# Patient Record
Sex: Female | Born: 1945 | Race: Black or African American | Hispanic: No | Smoking: Never smoker
Health system: Southern US, Community
[De-identification: ages and names within clinical notes are randomized; demographics above are authoritative.]

## PROBLEM LIST (undated history)

## (undated) DIAGNOSIS — H409 Unspecified glaucoma: Secondary | ICD-10-CM

## (undated) DIAGNOSIS — I10 Essential (primary) hypertension: Secondary | ICD-10-CM

## (undated) DIAGNOSIS — E039 Hypothyroidism, unspecified: Secondary | ICD-10-CM

## (undated) DIAGNOSIS — M6530 Trigger finger, unspecified finger: Secondary | ICD-10-CM

## (undated) DIAGNOSIS — N951 Menopausal and female climacteric states: Secondary | ICD-10-CM

## (undated) DIAGNOSIS — K219 Gastro-esophageal reflux disease without esophagitis: Secondary | ICD-10-CM

## (undated) DIAGNOSIS — G4700 Insomnia, unspecified: Secondary | ICD-10-CM

## (undated) DIAGNOSIS — E2839 Other primary ovarian failure: Secondary | ICD-10-CM

## (undated) DIAGNOSIS — K5900 Constipation, unspecified: Secondary | ICD-10-CM

## (undated) DIAGNOSIS — M1990 Unspecified osteoarthritis, unspecified site: Secondary | ICD-10-CM

## (undated) DIAGNOSIS — E079 Disorder of thyroid, unspecified: Secondary | ICD-10-CM

## (undated) DIAGNOSIS — R202 Paresthesia of skin: Secondary | ICD-10-CM

## (undated) DIAGNOSIS — E7800 Pure hypercholesterolemia, unspecified: Secondary | ICD-10-CM

## (undated) DIAGNOSIS — M25541 Pain in joints of right hand: Secondary | ICD-10-CM

## (undated) DIAGNOSIS — J45909 Unspecified asthma, uncomplicated: Secondary | ICD-10-CM

## (undated) DIAGNOSIS — H9311 Tinnitus, right ear: Secondary | ICD-10-CM

## (undated) DIAGNOSIS — M8580 Other specified disorders of bone density and structure, unspecified site: Secondary | ICD-10-CM

## (undated) DIAGNOSIS — M179 Osteoarthritis of knee, unspecified: Secondary | ICD-10-CM

## (undated) DIAGNOSIS — G4730 Sleep apnea, unspecified: Secondary | ICD-10-CM

## (undated) HISTORY — PX: BREAST EXCISIONAL BIOPSY: SUR124

## (undated) HISTORY — DX: Paresthesia of skin: R20.2

## (undated) HISTORY — DX: Other primary ovarian failure: E28.39

## (undated) HISTORY — DX: Hypercalcemia: E83.52

## (undated) HISTORY — DX: Menopausal and female climacteric states: N95.1

## (undated) HISTORY — DX: Pure hypercholesterolemia, unspecified: E78.00

## (undated) HISTORY — PX: GLAUCOMA SURGERY: SHX656

## (undated) HISTORY — DX: Gastro-esophageal reflux disease without esophagitis: K21.9

## (undated) HISTORY — DX: Trigger finger, unspecified finger: M65.30

## (undated) HISTORY — DX: Other specified disorders of bone density and structure, unspecified site: M85.80

## (undated) HISTORY — DX: Osteoarthritis of knee, unspecified: M17.9

## (undated) HISTORY — DX: Pain in joints of right hand: M25.541

## (undated) HISTORY — PX: ABDOMINAL HYSTERECTOMY: SHX81

## (undated) HISTORY — PX: SHOULDER SURGERY: SHX246

## (undated) HISTORY — DX: Constipation, unspecified: K59.00

## (undated) HISTORY — PX: OTHER SURGICAL HISTORY: SHX169

## (undated) HISTORY — DX: Insomnia, unspecified: G47.00

## (undated) HISTORY — DX: Tinnitus, right ear: H93.11

---

## 2000-03-03 ENCOUNTER — Encounter (HOSPITAL_BASED_OUTPATIENT_CLINIC_OR_DEPARTMENT_OTHER): Payer: Self-pay | Admitting: General Surgery

## 2000-03-03 ENCOUNTER — Encounter: Admission: RE | Admit: 2000-03-03 | Discharge: 2000-03-03 | Payer: Self-pay | Admitting: General Surgery

## 2001-03-30 ENCOUNTER — Emergency Department (HOSPITAL_COMMUNITY): Admission: EM | Admit: 2001-03-30 | Discharge: 2001-03-30 | Payer: Self-pay | Admitting: Emergency Medicine

## 2001-03-30 ENCOUNTER — Encounter: Payer: Self-pay | Admitting: Emergency Medicine

## 2001-06-08 ENCOUNTER — Encounter: Admission: RE | Admit: 2001-06-08 | Discharge: 2001-06-08 | Payer: Self-pay | Admitting: General Surgery

## 2001-06-08 ENCOUNTER — Encounter (HOSPITAL_BASED_OUTPATIENT_CLINIC_OR_DEPARTMENT_OTHER): Payer: Self-pay | Admitting: General Surgery

## 2001-06-23 ENCOUNTER — Ambulatory Visit (HOSPITAL_BASED_OUTPATIENT_CLINIC_OR_DEPARTMENT_OTHER): Admission: RE | Admit: 2001-06-23 | Discharge: 2001-06-23 | Payer: Self-pay | Admitting: Specialist

## 2001-07-01 ENCOUNTER — Other Ambulatory Visit: Admission: RE | Admit: 2001-07-01 | Discharge: 2001-07-01 | Payer: Self-pay | Admitting: Obstetrics and Gynecology

## 2005-03-17 ENCOUNTER — Encounter: Admission: RE | Admit: 2005-03-17 | Discharge: 2005-03-17 | Payer: Self-pay | Admitting: Family Medicine

## 2006-04-05 ENCOUNTER — Encounter: Admission: RE | Admit: 2006-04-05 | Discharge: 2006-04-05 | Payer: Self-pay | Admitting: Family Medicine

## 2007-04-29 ENCOUNTER — Encounter: Admission: RE | Admit: 2007-04-29 | Discharge: 2007-04-29 | Payer: Self-pay | Admitting: Family Medicine

## 2007-09-01 ENCOUNTER — Emergency Department (HOSPITAL_COMMUNITY): Admission: EM | Admit: 2007-09-01 | Discharge: 2007-09-01 | Payer: Self-pay | Admitting: Emergency Medicine

## 2008-04-30 ENCOUNTER — Encounter: Admission: RE | Admit: 2008-04-30 | Discharge: 2008-04-30 | Payer: Self-pay | Admitting: Family Medicine

## 2009-05-01 ENCOUNTER — Encounter: Admission: RE | Admit: 2009-05-01 | Discharge: 2009-05-01 | Payer: Self-pay | Admitting: Family Medicine

## 2009-05-15 ENCOUNTER — Encounter: Admission: RE | Admit: 2009-05-15 | Discharge: 2009-05-15 | Payer: Self-pay | Admitting: Family Medicine

## 2010-05-02 ENCOUNTER — Encounter: Admission: RE | Admit: 2010-05-02 | Discharge: 2010-05-02 | Payer: Self-pay | Admitting: Family Medicine

## 2011-02-20 NOTE — Op Note (Signed)
[place]  Patient:    BRIANNON, BOGGIO Visit Number: [id number] MRN: [id number]          Service Type: DSU Location: [facility] Attending Physician:  Erasmo Leventhal Dictated by:   R. Valma Cava, M.D. Proc. Date: 06/23/01 Admit Date:  06/23/2001                             Operative Report  PREOPERATIVE DIAGNOSES: 1. Right shoulder impingement syndrome. 2. Possible cuff tear. 2. Degenerative acromioclavicular joint.  POSTOPERATIVE DIAGNOSES: 1. Right shoulder _____ impingement syndrome. 2. Partial-thickness bursal surface tear, rotator cuff. 3. Degenerative acromioclavicular joint.  PROCEDURES: 1. Right shoulder arthroscopic evaluation. 2. Debridement of partial cuff tear. 3. Arthroscopic subacromial decompression. 4. Arthroscopic distal clavicle resection, Mumford procedure.  SURGEON:  Daniel L. Thomasena Edis, M.D.  ASSISTANT:  Irena Cords, P.A.-C.  ANESTHESIA:  Interscalene block, general, Maren Beach, M.D.  ESTIMATED BLOOD LOSS:  Less than 10 cc.  DRAINS:  None.  COMPLICATIONS:  None.  DISPOSITION:  To PACU stable.  DESCRIPTION OF PROCEDURE:  The patient was counseled in the holding area, the correct side was identified, the chart was reviewed, physical was done, and chart was signed appropriately.  IV started, blocks were given.  An interscalene block was infiltrated by Dr. Katrinka Blazing.  She was then taken to the OR, placed in the supine position, general endotracheal anesthesia, placed in the left lateral decubitus position, properly padded and bumped.  The right shoulder was examined with a full range of motion and stable.  Prepped with Duraprep and draped in a sterile fashion.  An _____ shoulder positioner was utilized in 30 degrees of abduction, 10 degrees of forward flexion, and 15 pounds of longitudinal traction.  Portal sites were marked with a marking pen, and then I used 8 cc of 0.5% Marcaine with epinephrine  after being instructed by the nurse anesthetist that it would be the appropriate dose based upon age and weight.  A posterior portal was created and the arthroscope was placed into the glenohumeral joint.  Diagnostic arthroscopy revealed no significant intra-articular abnormalities, and the articular cartilage was healthy.  The labrum was stable, biceps tendon was intact, and the rotator cuff appeared to be intact on the articular surface.  No evidence of instability.  Irrigated and all arthroscopic equipment removed.  The arthroscope then transferred to the subacromial region, where intense subacromial bursitis was encountered.  Anterolateral portal was created, staying proximal to isolate the nerve.  Motorized shaver was introduced, and subacromial bursectomy was performed.  The rotator cuff was found to have a partial-thickness tear of approximately 30% on the bursal surface, 1.5 cm in length, transverse in nature, at the junction of the supraspinatus and infraspinatus.  I did not feel this would require repair.  The edges were debrided back lightly with the motorized shaver back to healthy tissue.  He had a marked type 2 acromion.  The vapor system was used to remove the periosteum and release the CA ligament, and hemostasis was obtained.  A bur was then placed posteriorly and an anterior inferior acromioplasty was performed, converting from a type 2 to a type 1 acromion morphology, decompressing the subacromial region.  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  Accessory anterior portal was made just anterior to the [facility] joint.  A shaver was introduced.  The capsule was  resected, hemostasis obtained, and a bur was placed and the lateral 1.5 cm of clavicle was removed circumferentially.  The capsule was left intact.  The clavicle was palpated, found to be stable.  Subacromial was debrided of  any arthroscopic debris, and meticulous hemostasis was performed. We inspected, and no other abnormality was noted and after copious irrigation, all arthroscopic equipment was removed.  She was taken out of traction.  She had normal pulses at the end of the case. Portals were closed with 4-0 nylon suture, sterile compressive dressing was applied, and the shoulder was placed into a sling.  She was then turned supine and awakened and extubated and taken from the operating room in stable condition.  There were no complications.  Sponge and needle count were correct. Dictated by:   R. Valma Cava, M.D. Attending Physician:  Erasmo Leventhal DD:  06/23/01 TD:  06/23/01 Job: [id number]

## 2011-04-17 ENCOUNTER — Other Ambulatory Visit: Payer: Self-pay | Admitting: Family Medicine

## 2011-04-17 DIAGNOSIS — Z1231 Encounter for screening mammogram for malignant neoplasm of breast: Secondary | ICD-10-CM

## 2011-05-11 ENCOUNTER — Ambulatory Visit: Payer: Self-pay

## 2011-05-13 ENCOUNTER — Ambulatory Visit
Admission: RE | Admit: 2011-05-13 | Discharge: 2011-05-13 | Disposition: A | Discharge status: Home / Self Care | Payer: BC Managed Care – PPO | Source: Ambulatory Visit | Attending: Family Medicine | Admitting: Family Medicine

## 2011-05-13 DIAGNOSIS — Z1231 Encounter for screening mammogram for malignant neoplasm of breast: Secondary | ICD-10-CM

## 2012-01-03 ENCOUNTER — Ambulatory Visit (INDEPENDENT_AMBULATORY_CARE_PROVIDER_SITE_OTHER): Payer: BC Managed Care – PPO | Admitting: Family Medicine

## 2012-01-03 VITALS — BP 127/79 | HR 68 | Temp 97.9°F | Resp 16 | Ht 65.5 in | Wt 181.0 lb

## 2012-01-03 DIAGNOSIS — M1710 Unilateral primary osteoarthritis, unspecified knee: Secondary | ICD-10-CM

## 2012-01-03 DIAGNOSIS — IMO0002 Reserved for concepts with insufficient information to code with codable children: Secondary | ICD-10-CM

## 2012-01-03 DIAGNOSIS — L0390 Cellulitis, unspecified: Secondary | ICD-10-CM

## 2012-01-03 DIAGNOSIS — L0291 Cutaneous abscess, unspecified: Secondary | ICD-10-CM

## 2012-01-03 DIAGNOSIS — M1712 Unilateral primary osteoarthritis, left knee: Secondary | ICD-10-CM

## 2012-01-03 LAB — POCT CBC
HCT, POC: 38.4 % (ref 37.7–47.9)
Lymph, poc: 2.9 (ref 0.6–3.4)
MCHC: 31.8 g/dL (ref 31.8–35.4)
MID (cbc): 0.5 (ref 0–0.9)
MPV: 8.8 fL (ref 0–99.8)
POC Granulocyte: 2.4 (ref 2–6.9)
POC LYMPH PERCENT: 50.2 %L — AB (ref 10–50)
POC MID %: 8.8 %M (ref 0–12)
Platelet Count, POC: 234 10*3/uL (ref 142–424)
RDW, POC: 14.3 %

## 2012-01-03 LAB — POCT SEDIMENTATION RATE: POCT SED RATE: 54 mm/hr — AB (ref 0–22)

## 2012-01-03 MED ORDER — DOXYCYCLINE HYCLATE 100 MG PO TABS: 100.0000 mg | ORAL_TABLET | Freq: Two times a day (BID) | ORAL | Status: AC

## 2012-01-03 NOTE — Progress Notes (Signed)
  Subjective:    Patient ID: Gabrielle [name], female    DOB: [birth year], 66 y.o.   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 Has received (2) synvisc injections into both knees. Most recent injections on Wednesday. Since swelling and redness in (L) knee and lower extremity Pain at times with weight bearing and climbing stairs    Review of Systems     Objective:   Physical Exam   Results for orders placed in visit on 01/03/12  POCT CBC      Component Value Range   WBC 5.8  4.6 - 10.2 (K/uL)   Lymph, poc 2.9  0.6 - 3.4    POC LYMPH PERCENT 50.2 (*) 10 - 50 (%L)   MID (cbc) 0.5  0 - 0.9    POC MID % 8.8  0 - 12 (%M)   POC Granulocyte 2.4  2 - 6.9    Granulocyte percent 41.0  37 - 80 (%G)   RBC 4.23  4.04 - 5.48 (M/uL)   Hemoglobin 12.2  12.2 - 16.2 (g/dL)   HCT, POC 82.9  56.2 - 47.9 (%)   MCV 90.7  80 - 97 (fL)   MCH, POC 28.8  27 - 31.2 (pg)   MCHC 31.8  31.8 - 35.4 (g/dL)   RDW, POC 13.0     Platelet Count, POC 234  142 - 424 (K/uL)   MPV 8.8  0 - 99.8 (fL)  POCT SEDIMENTATION RATE      Component Value Range   POCT SED RATE 54 (*) 0 - 22 (mm/hr)         Assessment & Plan:   1. Cellulitis  POCT CBC, POCT SEDIMENTATION RATE  2. Left knee DJD     Doxycycline 100 mg Bid X 10days Mobic 7.5 mg Bid prn #30 RICE Patient has established follow up at [facility] and with Orthopedics this coming week. She will contact me should she have any problems in the interim.

## 2012-01-04 ENCOUNTER — Telehealth: Payer: Self-pay

## 2012-01-04 NOTE — Telephone Encounter (Signed)
[facility] sent a fax this morning for this patients records from 01/03/12 date of service  Pt is in the office now  Please send information - see fax  Sent this morning

## 2012-01-04 NOTE — Telephone Encounter (Signed)
Release received. Last office notes faxed via Epic.

## 2012-01-05 ENCOUNTER — Ambulatory Visit (HOSPITAL_COMMUNITY)
Admission: RE | Admit: 2012-01-05 | Discharge: 2012-01-05 | Disposition: A | Discharge status: Home / Self Care | Payer: BC Managed Care – PPO | Source: Ambulatory Visit | Attending: Family Medicine | Admitting: Family Medicine

## 2012-01-05 DIAGNOSIS — M7989 Other specified soft tissue disorders: Secondary | ICD-10-CM

## 2012-01-05 DIAGNOSIS — R609 Edema, unspecified: Secondary | ICD-10-CM

## 2012-01-05 DIAGNOSIS — M79609 Pain in unspecified limb: Secondary | ICD-10-CM

## 2012-01-05 DIAGNOSIS — R52 Pain, unspecified: Secondary | ICD-10-CM

## 2012-01-05 NOTE — Progress Notes (Signed)
*  PRELIMINARY RESULTS* Vascular Ultrasound Left lower extremity venous duplex has been completed.  Preliminary findings: Left= No evidence of DVT or baker's cyst.  Farrel Demark RDMS 01/05/2012, 9:33 AM

## 2012-04-14 ENCOUNTER — Other Ambulatory Visit: Payer: Self-pay | Admitting: Family Medicine

## 2012-04-14 DIAGNOSIS — Z1231 Encounter for screening mammogram for malignant neoplasm of breast: Secondary | ICD-10-CM

## 2012-05-16 ENCOUNTER — Ambulatory Visit
Admission: RE | Admit: 2012-05-16 | Discharge: 2012-05-16 | Disposition: A | Discharge status: Home / Self Care | Payer: BC Managed Care – PPO | Source: Ambulatory Visit | Attending: Family Medicine | Admitting: Family Medicine

## 2012-05-16 DIAGNOSIS — Z1231 Encounter for screening mammogram for malignant neoplasm of breast: Secondary | ICD-10-CM

## 2013-04-27 ENCOUNTER — Other Ambulatory Visit: Payer: Self-pay

## 2013-04-27 DIAGNOSIS — Z1231 Encounter for screening mammogram for malignant neoplasm of breast: Secondary | ICD-10-CM

## 2013-05-22 ENCOUNTER — Ambulatory Visit
Admission: RE | Admit: 2013-05-22 | Discharge: 2013-05-22 | Disposition: A | Discharge status: Home / Self Care | Payer: Medicare PPO | Source: Ambulatory Visit

## 2013-05-22 DIAGNOSIS — Z1231 Encounter for screening mammogram for malignant neoplasm of breast: Secondary | ICD-10-CM

## 2014-04-04 ENCOUNTER — Emergency Department (INDEPENDENT_AMBULATORY_CARE_PROVIDER_SITE_OTHER)
Admission: EM | Admit: 2014-04-04 | Discharge: 2014-04-04 | Disposition: A | Discharge status: Home / Self Care | Payer: Medicare PPO | Source: Home / Self Care

## 2014-04-04 ENCOUNTER — Encounter (HOSPITAL_COMMUNITY): Payer: Self-pay | Admitting: Emergency Medicine

## 2014-04-04 DIAGNOSIS — R0600 Dyspnea, unspecified: Secondary | ICD-10-CM

## 2014-04-04 DIAGNOSIS — R0609 Other forms of dyspnea: Secondary | ICD-10-CM

## 2014-04-04 DIAGNOSIS — R0989 Other specified symptoms and signs involving the circulatory and respiratory systems: Secondary | ICD-10-CM

## 2014-04-04 DIAGNOSIS — I10 Essential (primary) hypertension: Secondary | ICD-10-CM

## 2014-04-04 HISTORY — DX: Essential (primary) hypertension: I10

## 2014-04-04 HISTORY — DX: Disorder of thyroid, unspecified: E07.9

## 2014-04-04 NOTE — Discharge Instructions (Signed)
Hypertension °Hypertension is another name for high blood pressure. High blood pressure forces your heart to work harder to pump blood. A blood pressure reading has two numbers, which includes a higher number over a lower number (example: 110/72). °HOME CARE  °· Have your blood pressure rechecked by your doctor. °· Only take medicine as told by your doctor. Follow the directions carefully. The medicine does not work as well if you skip doses. Skipping doses also puts you at risk for problems. °· Do not smoke. °· Monitor your blood pressure at home as told by your doctor. °GET HELP IF: °· You think you are having a reaction to the medicine you are taking. °· You have repeat headaches or feel dizzy. °· You have puffiness (swelling) in your ankles. °· You have trouble with your vision. °GET HELP RIGHT AWAY IF:  °· You get a very bad headache and are confused. °· You feel weak, numb, or faint. °· You get chest or belly (abdominal) pain. °· You throw up (vomit). °· You cannot breathe very well. °MAKE SURE YOU:  °· Understand these instructions. °· Will watch your condition. °· Will get help right away if you are not doing well or get worse. °Document Released: 03/09/2008 Document Revised: 09/26/2013 Document Reviewed: 07/14/2013 °ExitCare® Patient Information ©2015 [employer]. This information is not intended to replace advice given to you by your health care provider. Make sure you discuss any questions you have with your health care provider. ° °Managing Your High Blood Pressure °Blood pressure is a measurement of how forceful your blood is pressing against the walls of the arteries. Arteries are muscular tubes within the circulatory system. Blood pressure does not stay the same. Blood pressure rises when you are active, excited, or nervous; and it lowers during sleep and relaxation. If the numbers measuring your blood pressure stay above normal most of the time, you are at risk for health problems. High blood  pressure (hypertension) is a long-term (chronic) condition in which blood pressure is elevated. °A blood pressure reading is recorded as two numbers, such as 120 over 80 (or 120/80). The first, higher number is called the systolic pressure. It is a measure of the pressure in your arteries as the heart beats. The second, lower number is called the diastolic pressure. It is a measure of the pressure in your arteries as the heart relaxes between beats.  °Keeping your blood pressure in a normal range is important to your overall health and prevention of health problems, such as heart disease and stroke. When your blood pressure is uncontrolled, your heart has to work harder than normal. High blood pressure is a very common condition in adults because blood pressure tends to rise with age. Men and women are equally likely to have hypertension but at different times in life. Before age 45, men are more likely to have hypertension. After 65 years of age, women are more likely to have it. Hypertension is especially common in African Americans. This condition often has no signs or symptoms. The cause of the condition is usually not known. Your caregiver can help you come up with a plan to keep your blood pressure in a normal, healthy range. °BLOOD PRESSURE STAGES °Blood pressure is classified into four stages: normal, prehypertension, stage 1, and stage 2. Your blood pressure reading will be used to determine what type of treatment, if any, is necessary. Appropriate treatment options are tied to these four stages:  °Normal °· Systolic pressure (mm Hg):   below 120.  Diastolic pressure (mm Hg): below 80. Prehypertension  Systolic pressure (mm Hg): 120 to 139.  Diastolic pressure (mm Hg): 80 to 89. Stage1  Systolic pressure (mm Hg): 140 to 159.  Diastolic pressure (mm Hg): 90 to 99. Stage2  Systolic pressure (mm Hg): 160 or above.  Diastolic pressure (mm Hg): 100 or above. RISKS RELATED TO HIGH BLOOD  PRESSURE Managing your blood pressure is an important responsibility. Uncontrolled high blood pressure can lead to:  A heart attack.  A stroke.  A weakened blood vessel (aneurysm).  Heart failure.  Kidney damage.  Eye damage.  Metabolic syndrome.  Memory and concentration problems. HOW TO MANAGE YOUR BLOOD PRESSURE Blood pressure can be managed effectively with lifestyle changes and medicines (if needed). Your caregiver will help you come up with a plan to bring your blood pressure within a normal range. Your plan should include the following: Education  Read all information provided by your caregivers about how to control blood pressure.  Educate yourself on the latest guidelines and treatment recommendations. New research is always being done to further define the risks and treatments for high blood pressure. Lifestylechanges  Control your weight.  Avoid smoking.  Stay physically active.  Reduce the amount of salt in your diet.  Reduce stress.  Control any chronic conditions, such as high cholesterol or diabetes.  Reduce your alcohol intake. Medicines  Several medicines (antihypertensive medicines) are available, if needed, to bring blood pressure within a normal range. Communication  Review all the medicines you take with your caregiver because there may be side effects or interactions.  Talk with your caregiver about your diet, exercise habits, and other lifestyle factors that may be contributing to high blood pressure.  See your caregiver regularly. Your caregiver can help you create and adjust your plan for managing high blood pressure. RECOMMENDATIONS FOR TREATMENT AND FOLLOW-UP  The following recommendations are based on current guidelines for managing high blood pressure in nonpregnant adults. Use these recommendations to identify the proper follow-up period or treatment option based on your blood pressure reading. You can discuss these options with your  caregiver.  Systolic pressure of 120 to 139 or diastolic pressure of 80 to 89: Follow up with your caregiver as directed.  Systolic pressure of 140 to 160 or diastolic pressure of 90 to 100: Follow up with your caregiver within 2 months.  Systolic pressure above 160 or diastolic pressure above 100: Follow up with your caregiver within 1 month.  Systolic pressure above 180 or diastolic pressure above 110: Consider antihypertensive therapy; follow up with your caregiver within 1 week.  Systolic pressure above 200 or diastolic pressure above 120: Begin antihypertensive therapy; follow up with your caregiver within 1 week. Document Released: 06/15/2012 Document Reviewed: 06/15/2012 [facility]ExitCare Patient Information 2015 [place]Care, [place]LL[place]. This information is not intended to replace advice given to you by your health care provider. Make sure you discuss any questions you have with your health care provider.  Shortness of Breath Shortness of breath means you have trouble breathing. It could also mean that you have a medical problem. You should get immediate medical care for shortness of breath. CAUSES   Not enough oxygen in the air such as with high altitudes or a smoke-filled room.  Certain lung diseases, infections, or problems.  Heart disease or conditions, such as angina or heart failure.  Low red blood cells (anemia).  Poor physical fitness, which can cause shortness of breath when you exercise.  Chest or  back injuries or stiffness.  Being overweight.  Smoking.  Anxiety, which can make you feel like you are not getting enough air. DIAGNOSIS  Serious medical problems can often be found during your physical exam. Tests may also be done to determine why you are having shortness of breath. Tests may include:  Chest X-rays.  Lung function tests.  Blood tests.  An electrocardiogram (ECG).  An ambulatory electrocardiogram. An ambulatory ECG records your heartbeat patterns over a  24-hour period.  Exercise testing.  A transthoracic echocardiogram (TTE). During echocardiography, sound waves are used to evaluate how blood flows through your heart.  A transesophageal echocardiogram (TEE).  Imaging scans. Your health care provider may not be able to find a cause for your shortness of breath after your exam. In this case, it is important to have a follow-up exam with your health care provider as directed.  TREATMENT  Treatment for shortness of breath depends on the cause of your symptoms and can vary greatly. HOME CARE INSTRUCTIONS   Do not smoke. Smoking is a common cause of shortness of breath. If you smoke, ask for help to quit.  Avoid being around chemicals or things that may bother your breathing, such as paint fumes and dust.  Rest as needed. Slowly resume your usual activities.  If medicines were prescribed, take them as directed for the full length of time directed. This includes oxygen and any inhaled medicines.  Keep all follow-up appointments as directed by your health care provider. SEEK MEDICAL CARE IF:   Your condition does not improve in the time expected.  You have a hard time doing your normal activities even with rest.  You have any new symptoms. SEEK IMMEDIATE MEDICAL CARE IF:   Your shortness of breath gets worse.  You feel light-headed, faint, or develop a cough not controlled with medicines.  You start coughing up blood.  You have pain with breathing.  You have chest pain or pain in your arms, shoulders, or abdomen.  You have a fever.  You are unable to walk up stairs or exercise the way you normally do. MAKE SURE YOU:  Understand these instructions.  Will watch your condition.  Will get help right away if you are not doing well or get worse. Document Released: 06/16/2001 Document Revised: 09/26/2013 Document Reviewed: 12/07/2011 [facility] SuitesExitCare Patient Information 2015 [place]ExitCare, MarylandLLC. This information is not intended to replace  advice given to you by your health care provider. Make sure you discuss any questions you have with your health care provider.

## 2014-04-04 NOTE — ED Notes (Signed)
Pt  Reports  Developed  Some  Shortness  Of  Breath  This  Am      And     Took  Sone  Of an  Albuterol  Inhaler       -  She      Reports        She got a  Little  Jittery         But  Now  Feels  Better             She  Is  Sitting upright on the  Exam table  Speaking in complete  sentances

## 2014-04-04 NOTE — ED Provider Notes (Signed)
Medical screening examination/treatment/procedure(s) were performed by non-physician practitioner and as supervising physician I was immediately available for consultation/collaboration.  Anuel Sitter, M.D.  Bre Pecina C Wylma Tatem, MD 04/04/14 1026 

## 2014-04-04 NOTE — ED Provider Notes (Signed)
CSN: [id number]     Arrival date & time 04/04/14  0910 History   First MD Initiated Contact with Patient 04/04/14 [phone]     Chief Complaint  Patient presents with  . Shortness of Breath   (Consider location/radiation/quality/duration/timing/severity/associated sxs/prior Treatment) HPI Comments: On a few occasions this 68 year old female has been awakening with the mild elevation in blood pressure. This morning it was 154/93. She has also had intermittent episodes of early morning shortness of breath that lasts for a few minutes. She becomes active and moving around the shortness of breath abates. In fact, the more movement and walking she does the better she feels. This morning she used an old Ventolin H. FA for her breathing and this made her temporarily jittery. She admits that she walks "a lot" during the day without symptoms. No chest pain at rest or with exertion.   Past Medical History  Diagnosis Date  . Hypertension   . Thyroid disease    Past Surgical History  Procedure Laterality Date  . Abdominal hysterectomy    . Shoulder surgery     Family History  Problem Relation Age of Onset  . Diabetes Father   . Cancer Father   . Hypertension Father    History  Substance Use Topics  . Smoking status: Never Smoker   . Smokeless tobacco: Not on file  . Alcohol Use: No   OB History   Grav Para Term Preterm Abortions TAB SAB Ect Mult Living                 Review of Systems  Constitutional: Negative for fever, activity change, appetite change and fatigue.  HENT: Negative.   Respiratory: Positive for shortness of breath. Negative for cough, wheezing and stridor.        This occurred earlier this morning that has since abated.  Cardiovascular: Negative.  Negative for chest pain, palpitations and leg swelling.  Gastrointestinal: Negative.   Genitourinary: Negative.   Musculoskeletal: Negative.   Skin: Negative for pallor and rash.  Neurological: Negative.     Allergies   Codeine; Latex; and Sulfa antibiotics  Home Medications   Prior to Admission medications   Medication Sig Start Date End Date Taking? Authorizing Provider  nebivolol (BYSTOLIC) 10 MG tablet Take 10 mg by mouth daily.   Yes Historical Provider, MD  omeprazole (PRILOSEC) 40 MG capsule Take 40 mg by mouth daily.   Yes Historical Provider, MD  hydrochlorothiazide (HYDRODIURIL) 25 MG tablet Take 25 mg by mouth daily.    Historical Provider, MD  levothyroxine (SYNTHROID, LEVOTHROID) 50 MCG tablet Take 50 mcg by mouth daily.    Historical Provider, MD  potassium chloride (K-DUR,KLOR-CON) 10 MEQ tablet Take 10 mEq by mouth 2 (two) times daily.    Historical Provider, MD  ranitidine (ZANTAC) 300 MG tablet Take 300 mg by mouth at bedtime.    Historical Provider, MD  simvastatin (ZOCOR) 20 MG tablet Take 20 mg by mouth every evening.    Historical Provider, MD   BP 137/74  Pulse 58  Temp(Src) 97.1 F (36.2 C) (Oral)  Resp 20  SpO2 96% Physical Exam  Nursing note and vitals reviewed. Constitutional: She is oriented to person, place, and time. She appears well-developed and well-nourished.  Eyes: Conjunctivae and EOM are normal.  Neck: Normal range of motion. Neck supple.  Cardiovascular: Normal rate, regular rhythm, normal heart sounds and intact distal pulses.   No murmur heard. Pulmonary/Chest: Effort normal and breath sounds normal. No respiratory  distress. She has no wheezes. She has no rales.  Musculoskeletal: She exhibits no edema and no tenderness.  Lymphadenopathy:    She has no cervical adenopathy.  Neurological: She is alert and oriented to person, place, and time. She exhibits normal muscle tone.  Skin: Skin is warm and dry.  Psychiatric: She has a normal mood and affect.    ED Course  Procedures (including critical care time) Labs Review Labs Reviewed - No data to display  Imaging Review No results found.   MDM   1. Essential hypertension   2. Dyspnea      Reassurance. Cont meds. Discuss with your doctor about any needed changes. VSS today. Pt asymptomatic. Normal exam.       Hayden Rasmussenavid Yasin Ducat, NP 04/04/14 [phone]

## 2014-05-08 ENCOUNTER — Other Ambulatory Visit: Payer: Self-pay

## 2014-05-08 DIAGNOSIS — Z1231 Encounter for screening mammogram for malignant neoplasm of breast: Secondary | ICD-10-CM

## 2014-05-23 ENCOUNTER — Ambulatory Visit
Admission: RE | Admit: 2014-05-23 | Discharge: 2014-05-23 | Disposition: A | Discharge status: Home / Self Care | Payer: Medicare PPO | Source: Ambulatory Visit

## 2014-05-23 DIAGNOSIS — Z1231 Encounter for screening mammogram for malignant neoplasm of breast: Secondary | ICD-10-CM

## 2015-05-06 ENCOUNTER — Other Ambulatory Visit: Payer: Self-pay

## 2015-05-06 DIAGNOSIS — Z1231 Encounter for screening mammogram for malignant neoplasm of breast: Secondary | ICD-10-CM

## 2015-06-12 ENCOUNTER — Ambulatory Visit
Admission: RE | Admit: 2015-06-12 | Discharge: 2015-06-12 | Disposition: A | Discharge status: Home / Self Care | Payer: Medicare PPO | Source: Ambulatory Visit

## 2015-06-12 DIAGNOSIS — Z1231 Encounter for screening mammogram for malignant neoplasm of breast: Secondary | ICD-10-CM

## 2016-05-22 ENCOUNTER — Other Ambulatory Visit: Payer: Self-pay | Admitting: Family Medicine

## 2016-05-22 DIAGNOSIS — Z1231 Encounter for screening mammogram for malignant neoplasm of breast: Secondary | ICD-10-CM

## 2016-06-17 ENCOUNTER — Ambulatory Visit
Admission: RE | Admit: 2016-06-17 | Discharge: 2016-06-17 | Disposition: A | Discharge status: Home / Self Care | Payer: Medicare Other | Source: Ambulatory Visit | Attending: Family Medicine | Admitting: Family Medicine

## 2016-06-17 DIAGNOSIS — Z1231 Encounter for screening mammogram for malignant neoplasm of breast: Secondary | ICD-10-CM

## 2017-05-31 ENCOUNTER — Other Ambulatory Visit: Payer: Self-pay | Admitting: Family Medicine

## 2017-05-31 DIAGNOSIS — Z1231 Encounter for screening mammogram for malignant neoplasm of breast: Secondary | ICD-10-CM

## 2017-06-21 ENCOUNTER — Ambulatory Visit: Payer: Medicare Other

## 2017-06-22 ENCOUNTER — Ambulatory Visit
Admission: RE | Admit: 2017-06-22 | Discharge: 2017-06-22 | Disposition: A | Discharge status: Home / Self Care | Payer: Medicare Other | Source: Ambulatory Visit | Attending: Family Medicine | Admitting: Family Medicine

## 2017-06-22 DIAGNOSIS — Z1231 Encounter for screening mammogram for malignant neoplasm of breast: Secondary | ICD-10-CM

## 2017-06-23 ENCOUNTER — Ambulatory Visit: Payer: Medicare Other

## 2017-10-05 HISTORY — PX: FOOT SURGERY: SHX648

## 2018-03-02 ENCOUNTER — Encounter (HOSPITAL_COMMUNITY): Payer: Self-pay

## 2018-03-02 ENCOUNTER — Emergency Department (HOSPITAL_COMMUNITY): Payer: Medicare Other

## 2018-03-02 ENCOUNTER — Emergency Department (HOSPITAL_COMMUNITY)
Admission: EM | Admit: 2018-03-02 | Discharge: 2018-03-02 | Disposition: A | Discharge status: Home / Self Care | Payer: Medicare Other | Attending: Emergency Medicine | Admitting: Emergency Medicine

## 2018-03-02 DIAGNOSIS — R079 Chest pain, unspecified: Secondary | ICD-10-CM | POA: Diagnosis not present

## 2018-03-02 DIAGNOSIS — Z79899 Other long term (current) drug therapy: Secondary | ICD-10-CM | POA: Diagnosis not present

## 2018-03-02 DIAGNOSIS — I10 Essential (primary) hypertension: Secondary | ICD-10-CM | POA: Diagnosis not present

## 2018-03-02 DIAGNOSIS — R519 Headache, unspecified: Secondary | ICD-10-CM

## 2018-03-02 DIAGNOSIS — R51 Headache: Secondary | ICD-10-CM | POA: Insufficient documentation

## 2018-03-02 NOTE — Discharge Instructions (Signed)
Continue taking your usual medications.  Consider restarting her CPAP.  Follow-up with your primary care doctor as needed for problems.

## 2018-03-02 NOTE — ED Triage Notes (Signed)
Pt complains of chest pain this am, she states that it was a sharp pressure like pain, it only happened once and she has no other complaints

## 2018-03-02 NOTE — ED Provider Notes (Signed)
[facility]-EMERGENCY DEPT Provider Note   CSN: [id number] Arrival date & time: 03/02/18  0865     History   Chief Complaint Chief Complaint  Patient presents with  . Chest Pain    HPI Gabrielle [name] is a 72 y.o. female.  HPI   She presents for evaluation of chest pain which occurred this morning as she was getting ready for work.  The discomfort was felt as "sharp," and in the upper chest.  She then felt like "my sinuses opened up," and developed a headache.  All of the symptoms resolved within 5 minutes.  She went to work but decided to come here to get checked because she was concerned.  No prior similar problems.  She took her medications this morning, and ate breakfast as usual.  No recent fever, chills, nausea, vomiting, weakness or dizziness.  She has noticed some occasional awakening at night gasping for breath, and is wondering if she needs to restart her CPAP.  She stopped it about 2 years ago, because she did not think it was helping.  There are no other known modifying factors.  Past Medical History:  Diagnosis Date  . Hypertension   . Thyroid disease     There are no active problems to display for this patient.   Past Surgical History:  Procedure Laterality Date  . ABDOMINAL HYSTERECTOMY    . SHOULDER SURGERY       OB History   None      Home Medications    Prior to Admission medications   Medication Sig Start Date End Date Taking? Authorizing Provider  hydrochlorothiazide (HYDRODIURIL) 25 MG tablet Take 25 mg by mouth daily.    [provider]  levothyroxine (SYNTHROID, LEVOTHROID) 50 MCG tablet Take 50 mcg by mouth daily.    [provider]  nebivolol (BYSTOLIC) 10 MG tablet Take 10 mg by mouth daily.    [provider]  omeprazole (PRILOSEC) 40 MG capsule Take 40 mg by mouth daily.    [provider]  potassium chloride (K-DUR,KLOR-CON) 10 MEQ tablet Take 10 mEq by mouth 2 (two) times daily.     [provider]  ranitidine (ZANTAC) 300 MG tablet Take 300 mg by mouth at bedtime.    [provider]  simvastatin (ZOCOR) 20 MG tablet Take 20 mg by mouth every evening.    [provider]    Family History Family History  Problem Relation Age of Onset  . Diabetes Father   . Cancer Father   . Hypertension Father     Social History Social History   Tobacco Use  . Smoking status: Never Smoker  . Smokeless tobacco: Never Used  Substance Use Topics  . Alcohol use: No  . Drug use: Never     Allergies   Codeine; Latex; and Sulfa antibiotics   Review of Systems Review of Systems  All other systems reviewed and are negative.    Physical Exam Updated Vital Signs BP 131/84 (BP Location: Left Arm)   Pulse (!) 54   Temp 97.6 F (36.4 C) (Oral)   Resp 16   SpO2 100%   Physical Exam  Constitutional: She is oriented to person, place, and time. She appears well-developed and well-nourished. No distress.  HENT:  Head: Normocephalic and atraumatic.  Eyes: Pupils are equal, round, and reactive to light. Conjunctivae and EOM are normal.  Neck: Normal range of motion and phonation normal. Neck supple.  Cardiovascular: Normal rate and  regular rhythm.  Pulmonary/Chest: Effort normal and breath sounds normal. No respiratory distress. She exhibits no tenderness.  Abdominal: Soft. She exhibits no distension. There is no tenderness. There is no guarding.  Musculoskeletal: Normal range of motion.  Neurological: She is alert and oriented to person, place, and time. She exhibits normal muscle tone.  Skin: Skin is warm and dry.  Psychiatric: She has a normal mood and affect. Her behavior is normal. Judgment and thought content normal.  Nursing note and vitals reviewed.    ED Treatments / Results  Labs (all labs ordered are listed, but only abnormal results are displayed) Labs Reviewed  BASIC METABOLIC PANEL  CBC  I-STAT TROPONIN, ED    EKG EKG  Interpretation  Date/Time:  Wednesday Mar 02 2018 09:07:43 EDT Ventricular Rate:  60 PR Interval:    QRS Duration: 97 QT Interval:  440 QTC Calculation: 440 R Axis:   28 Text Interpretation:  Sinus rhythm Low voltage, precordial leads No old tracing to compare Confirmed by Mancel Bale [phone]) on 03/02/2018 9:10:54 AM   Radiology Dg Chest 2 View  Result Date: 03/02/2018 CLINICAL DATA:  Acute onset of mid chest pain. EXAM: CHEST - 2 VIEW COMPARISON:  None. FINDINGS: The lungs are well-aerated and clear. There is no evidence of focal opacification, pleural effusion or pneumothorax. The heart is normal in size; the mediastinal contour is within normal limits. No acute osseous abnormalities are seen. IMPRESSION: No acute cardiopulmonary process seen. Electronically Signed   By: Roanna Raider M.D.   On: 03/02/2018 06:17    Procedures Procedures (including critical care time)  Medications Ordered in ED Medications - No data to display   Initial Impression / Assessment and Plan / ED Course  I have reviewed the triage vital signs and the nursing notes.  Pertinent labs & imaging results that were available during my care of the patient were reviewed by me and considered in my medical decision making (see chart for details).  Clinical Course as of Mar 02 949  Wed Mar 02, 2018  0945 No acute disease, images reviewed  DG Chest 2 View [EW]  0945 Normal EKG  EKG 12-Lead [EW]    Clinical Course User Index [EW] Mancel Bale, MD     Patient Vitals for the past 24 hrs:  BP Temp Temp src Pulse Resp SpO2  03/02/18 0910 131/84 - - (!) 54 16 100 %  03/02/18 0534 (!) 144/84 97.6 F (36.4 C) Oral 60 18 97 %    9:44 AM Reevaluation with update and discussion. After initial assessment and treatment, an updated evaluation reveals no change in clinical status, findings discussed with patient and all questions answered. Mancel Bale   Medical Decision Making: Nonspecific chest pain and  headache, very brief episode without recurrence.  Patient has been having some trouble with her sleep apnea, and may have disrupted sleep as contributor to the current symptoms.  Screening evaluation today with EKG and chest x-ray were reassuring.  Doubt ACS, PE or pneumonia.  CRITICAL CARE-no Performed by: Mancel Bale   Nursing Notes Reviewed/ Care Coordinated Applicable Imaging Reviewed Interpretation of Laboratory Data incorporated into ED treatment  The patient appears reasonably screened and/or stabilized for discharge and I doubt any other medical condition or other [facility] requiring further screening, evaluation, or treatment in the ED at this time prior to discharge.  Plan: Home Medications-continue usual; Home Treatments-rest, fluids, reinitiate CPAP use; return here if the recommended treatment, does not improve the symptoms; Recommended  follow up-PCP, as needed     Final Clinical Impressions(s) / ED Diagnoses   Final diagnoses:  Nonspecific chest pain  Nonintractable headache, unspecified chronicity pattern, unspecified headache type    ED Discharge Orders    None       Mancel Bale, MD 03/02/18 [phone]

## 2018-03-02 NOTE — ED Notes (Signed)
Dr Effie Shy in to speak with pt. He has elected not to obtain the blood work after talking with pt and reviewing EKG and chest x-ray

## 2018-05-16 ENCOUNTER — Other Ambulatory Visit: Payer: Self-pay | Admitting: Family Medicine

## 2018-05-16 DIAGNOSIS — Z1231 Encounter for screening mammogram for malignant neoplasm of breast: Secondary | ICD-10-CM

## 2018-06-23 ENCOUNTER — Ambulatory Visit
Admission: RE | Admit: 2018-06-23 | Discharge: 2018-06-23 | Disposition: A | Discharge status: Home / Self Care | Payer: Medicare Other | Source: Ambulatory Visit | Attending: Family Medicine | Admitting: Family Medicine

## 2018-06-23 DIAGNOSIS — Z1231 Encounter for screening mammogram for malignant neoplasm of breast: Secondary | ICD-10-CM

## 2019-06-14 ENCOUNTER — Other Ambulatory Visit: Payer: Self-pay | Admitting: Family Medicine

## 2019-06-14 DIAGNOSIS — Z1231 Encounter for screening mammogram for malignant neoplasm of breast: Secondary | ICD-10-CM

## 2019-07-28 ENCOUNTER — Other Ambulatory Visit: Payer: Self-pay

## 2019-07-28 ENCOUNTER — Ambulatory Visit
Admission: RE | Admit: 2019-07-28 | Discharge: 2019-07-28 | Disposition: A | Discharge status: Home / Self Care | Payer: Medicare Other | Source: Ambulatory Visit | Attending: Family Medicine | Admitting: Family Medicine

## 2019-07-28 DIAGNOSIS — Z1231 Encounter for screening mammogram for malignant neoplasm of breast: Secondary | ICD-10-CM

## 2019-07-31 ENCOUNTER — Other Ambulatory Visit: Payer: Self-pay | Admitting: Family Medicine

## 2019-07-31 DIAGNOSIS — R928 Other abnormal and inconclusive findings on diagnostic imaging of breast: Secondary | ICD-10-CM

## 2019-08-01 ENCOUNTER — Other Ambulatory Visit: Payer: Self-pay | Admitting: Family Medicine

## 2019-08-01 DIAGNOSIS — R202 Paresthesia of skin: Secondary | ICD-10-CM

## 2019-08-02 ENCOUNTER — Other Ambulatory Visit: Payer: Self-pay

## 2019-08-02 DIAGNOSIS — Z20822 Contact with and (suspected) exposure to covid-19: Secondary | ICD-10-CM

## 2019-08-03 LAB — NOVEL CORONAVIRUS, NAA: SARS-CoV-2, NAA: NOT DETECTED

## 2019-08-04 ENCOUNTER — Other Ambulatory Visit: Payer: Self-pay

## 2019-08-04 ENCOUNTER — Ambulatory Visit
Admission: RE | Admit: 2019-08-04 | Discharge: 2019-08-04 | Disposition: A | Discharge status: Home / Self Care | Payer: Medicare Other | Source: Ambulatory Visit | Attending: Family Medicine | Admitting: Family Medicine

## 2019-08-04 DIAGNOSIS — R928 Other abnormal and inconclusive findings on diagnostic imaging of breast: Secondary | ICD-10-CM

## 2019-08-19 ENCOUNTER — Ambulatory Visit
Admission: RE | Admit: 2019-08-19 | Discharge: 2019-08-19 | Disposition: A | Discharge status: Home / Self Care | Payer: Medicare Other | Source: Ambulatory Visit | Attending: Family Medicine | Admitting: Family Medicine

## 2019-08-19 ENCOUNTER — Other Ambulatory Visit: Payer: Self-pay

## 2019-08-19 DIAGNOSIS — R202 Paresthesia of skin: Secondary | ICD-10-CM

## 2019-12-14 DIAGNOSIS — Z885 Allergy status to narcotic agent status: Secondary | ICD-10-CM | POA: Diagnosis not present

## 2019-12-14 DIAGNOSIS — Z882 Allergy status to sulfonamides status: Secondary | ICD-10-CM | POA: Diagnosis not present

## 2019-12-14 DIAGNOSIS — H269 Unspecified cataract: Secondary | ICD-10-CM | POA: Diagnosis not present

## 2019-12-14 DIAGNOSIS — R32 Unspecified urinary incontinence: Secondary | ICD-10-CM | POA: Diagnosis not present

## 2019-12-14 DIAGNOSIS — M1990 Unspecified osteoarthritis, unspecified site: Secondary | ICD-10-CM | POA: Diagnosis not present

## 2019-12-14 DIAGNOSIS — K08409 Partial loss of teeth, unspecified cause, unspecified class: Secondary | ICD-10-CM | POA: Diagnosis not present

## 2019-12-14 DIAGNOSIS — I10 Essential (primary) hypertension: Secondary | ICD-10-CM | POA: Diagnosis not present

## 2019-12-14 DIAGNOSIS — J309 Allergic rhinitis, unspecified: Secondary | ICD-10-CM | POA: Diagnosis not present

## 2019-12-14 DIAGNOSIS — E039 Hypothyroidism, unspecified: Secondary | ICD-10-CM | POA: Diagnosis not present

## 2019-12-14 DIAGNOSIS — Z9181 History of falling: Secondary | ICD-10-CM | POA: Diagnosis not present

## 2019-12-14 DIAGNOSIS — G8929 Other chronic pain: Secondary | ICD-10-CM | POA: Diagnosis not present

## 2019-12-14 DIAGNOSIS — E785 Hyperlipidemia, unspecified: Secondary | ICD-10-CM | POA: Diagnosis not present

## 2019-12-14 DIAGNOSIS — Z91040 Latex allergy status: Secondary | ICD-10-CM | POA: Diagnosis not present

## 2020-01-03 DIAGNOSIS — E039 Hypothyroidism, unspecified: Secondary | ICD-10-CM | POA: Diagnosis not present

## 2020-01-03 DIAGNOSIS — M65331 Trigger finger, right middle finger: Secondary | ICD-10-CM | POA: Diagnosis not present

## 2020-01-03 DIAGNOSIS — H9311 Tinnitus, right ear: Secondary | ICD-10-CM | POA: Diagnosis not present

## 2020-01-03 DIAGNOSIS — Z1389 Encounter for screening for other disorder: Secondary | ICD-10-CM | POA: Diagnosis not present

## 2020-01-03 DIAGNOSIS — M170 Bilateral primary osteoarthritis of knee: Secondary | ICD-10-CM | POA: Diagnosis not present

## 2020-01-03 DIAGNOSIS — E7800 Pure hypercholesterolemia, unspecified: Secondary | ICD-10-CM | POA: Diagnosis not present

## 2020-01-03 DIAGNOSIS — I10 Essential (primary) hypertension: Secondary | ICD-10-CM | POA: Diagnosis not present

## 2020-01-03 DIAGNOSIS — Z0000 Encounter for general adult medical examination without abnormal findings: Secondary | ICD-10-CM | POA: Diagnosis not present

## 2020-01-25 DIAGNOSIS — M65331 Trigger finger, right middle finger: Secondary | ICD-10-CM | POA: Diagnosis not present

## 2020-01-25 DIAGNOSIS — M19041 Primary osteoarthritis, right hand: Secondary | ICD-10-CM | POA: Diagnosis not present

## 2020-02-01 DIAGNOSIS — M170 Bilateral primary osteoarthritis of knee: Secondary | ICD-10-CM | POA: Diagnosis not present

## 2020-02-01 DIAGNOSIS — M1712 Unilateral primary osteoarthritis, left knee: Secondary | ICD-10-CM | POA: Diagnosis not present

## 2020-02-01 DIAGNOSIS — M1711 Unilateral primary osteoarthritis, right knee: Secondary | ICD-10-CM | POA: Diagnosis not present

## 2020-02-12 DIAGNOSIS — E7800 Pure hypercholesterolemia, unspecified: Secondary | ICD-10-CM | POA: Diagnosis not present

## 2020-02-12 DIAGNOSIS — E039 Hypothyroidism, unspecified: Secondary | ICD-10-CM | POA: Diagnosis not present

## 2020-02-12 DIAGNOSIS — I10 Essential (primary) hypertension: Secondary | ICD-10-CM | POA: Diagnosis not present

## 2020-03-06 DIAGNOSIS — N952 Postmenopausal atrophic vaginitis: Secondary | ICD-10-CM | POA: Diagnosis not present

## 2020-03-06 DIAGNOSIS — R39198 Other difficulties with micturition: Secondary | ICD-10-CM | POA: Diagnosis not present

## 2020-03-06 DIAGNOSIS — Z90710 Acquired absence of both cervix and uterus: Secondary | ICD-10-CM | POA: Diagnosis not present

## 2020-03-06 DIAGNOSIS — N898 Other specified noninflammatory disorders of vagina: Secondary | ICD-10-CM | POA: Diagnosis not present

## 2020-03-26 DIAGNOSIS — M170 Bilateral primary osteoarthritis of knee: Secondary | ICD-10-CM | POA: Diagnosis not present

## 2020-04-02 DIAGNOSIS — M170 Bilateral primary osteoarthritis of knee: Secondary | ICD-10-CM | POA: Diagnosis not present

## 2020-04-09 DIAGNOSIS — M170 Bilateral primary osteoarthritis of knee: Secondary | ICD-10-CM | POA: Diagnosis not present

## 2020-04-15 DIAGNOSIS — M67912 Unspecified disorder of synovium and tendon, left shoulder: Secondary | ICD-10-CM | POA: Diagnosis not present

## 2020-04-15 DIAGNOSIS — M67911 Unspecified disorder of synovium and tendon, right shoulder: Secondary | ICD-10-CM | POA: Diagnosis not present

## 2020-07-08 DIAGNOSIS — M65331 Trigger finger, right middle finger: Secondary | ICD-10-CM | POA: Diagnosis not present

## 2020-07-08 DIAGNOSIS — E039 Hypothyroidism, unspecified: Secondary | ICD-10-CM | POA: Diagnosis not present

## 2020-07-08 DIAGNOSIS — M170 Bilateral primary osteoarthritis of knee: Secondary | ICD-10-CM | POA: Diagnosis not present

## 2020-07-08 DIAGNOSIS — Z23 Encounter for immunization: Secondary | ICD-10-CM | POA: Diagnosis not present

## 2020-07-08 DIAGNOSIS — E7800 Pure hypercholesterolemia, unspecified: Secondary | ICD-10-CM | POA: Diagnosis not present

## 2020-07-08 DIAGNOSIS — I10 Essential (primary) hypertension: Secondary | ICD-10-CM | POA: Diagnosis not present

## 2020-07-08 DIAGNOSIS — H9311 Tinnitus, right ear: Secondary | ICD-10-CM | POA: Diagnosis not present

## 2020-07-12 ENCOUNTER — Other Ambulatory Visit: Payer: Self-pay | Admitting: Family Medicine

## 2020-07-12 DIAGNOSIS — Z1231 Encounter for screening mammogram for malignant neoplasm of breast: Secondary | ICD-10-CM

## 2020-07-22 DIAGNOSIS — H40003 Preglaucoma, unspecified, bilateral: Secondary | ICD-10-CM | POA: Diagnosis not present

## 2020-08-13 ENCOUNTER — Other Ambulatory Visit: Payer: Self-pay

## 2020-08-13 ENCOUNTER — Ambulatory Visit
Admission: RE | Admit: 2020-08-13 | Discharge: 2020-08-13 | Disposition: A | Discharge status: Home / Self Care | Payer: Medicare PPO | Source: Ambulatory Visit | Attending: Family Medicine | Admitting: Family Medicine

## 2020-08-13 DIAGNOSIS — Z1231 Encounter for screening mammogram for malignant neoplasm of breast: Secondary | ICD-10-CM | POA: Diagnosis not present

## 2020-09-02 DIAGNOSIS — H9311 Tinnitus, right ear: Secondary | ICD-10-CM | POA: Diagnosis not present

## 2020-09-02 DIAGNOSIS — H903 Sensorineural hearing loss, bilateral: Secondary | ICD-10-CM | POA: Diagnosis not present

## 2020-10-02 DIAGNOSIS — M170 Bilateral primary osteoarthritis of knee: Secondary | ICD-10-CM | POA: Diagnosis not present

## 2020-10-02 DIAGNOSIS — M25461 Effusion, right knee: Secondary | ICD-10-CM | POA: Diagnosis not present

## 2020-10-02 DIAGNOSIS — M25462 Effusion, left knee: Secondary | ICD-10-CM | POA: Diagnosis not present

## 2020-10-08 DIAGNOSIS — L243 Irritant contact dermatitis due to cosmetics: Secondary | ICD-10-CM | POA: Diagnosis not present

## 2020-11-01 DIAGNOSIS — M170 Bilateral primary osteoarthritis of knee: Secondary | ICD-10-CM | POA: Diagnosis not present

## 2020-12-09 DIAGNOSIS — B351 Tinea unguium: Secondary | ICD-10-CM | POA: Diagnosis not present

## 2021-01-08 DIAGNOSIS — Z1389 Encounter for screening for other disorder: Secondary | ICD-10-CM | POA: Diagnosis not present

## 2021-01-08 DIAGNOSIS — M65331 Trigger finger, right middle finger: Secondary | ICD-10-CM | POA: Diagnosis not present

## 2021-01-08 DIAGNOSIS — I10 Essential (primary) hypertension: Secondary | ICD-10-CM | POA: Diagnosis not present

## 2021-01-08 DIAGNOSIS — M170 Bilateral primary osteoarthritis of knee: Secondary | ICD-10-CM | POA: Diagnosis not present

## 2021-01-08 DIAGNOSIS — E7800 Pure hypercholesterolemia, unspecified: Secondary | ICD-10-CM | POA: Diagnosis not present

## 2021-01-08 DIAGNOSIS — E039 Hypothyroidism, unspecified: Secondary | ICD-10-CM | POA: Diagnosis not present

## 2021-01-08 DIAGNOSIS — H9311 Tinnitus, right ear: Secondary | ICD-10-CM | POA: Diagnosis not present

## 2021-01-08 DIAGNOSIS — Z0000 Encounter for general adult medical examination without abnormal findings: Secondary | ICD-10-CM | POA: Diagnosis not present

## 2021-01-24 DIAGNOSIS — M170 Bilateral primary osteoarthritis of knee: Secondary | ICD-10-CM | POA: Diagnosis not present

## 2021-02-10 DIAGNOSIS — H40003 Preglaucoma, unspecified, bilateral: Secondary | ICD-10-CM | POA: Diagnosis not present

## 2021-02-10 DIAGNOSIS — H02834 Dermatochalasis of left upper eyelid: Secondary | ICD-10-CM | POA: Diagnosis not present

## 2021-02-10 DIAGNOSIS — H02831 Dermatochalasis of right upper eyelid: Secondary | ICD-10-CM | POA: Diagnosis not present

## 2021-04-10 DIAGNOSIS — W57XXXA Bitten or stung by nonvenomous insect and other nonvenomous arthropods, initial encounter: Secondary | ICD-10-CM | POA: Diagnosis not present

## 2021-04-10 DIAGNOSIS — M542 Cervicalgia: Secondary | ICD-10-CM | POA: Diagnosis not present

## 2021-05-02 DIAGNOSIS — M170 Bilateral primary osteoarthritis of knee: Secondary | ICD-10-CM | POA: Diagnosis not present

## 2021-07-11 ENCOUNTER — Other Ambulatory Visit: Payer: Self-pay | Admitting: Family Medicine

## 2021-07-11 DIAGNOSIS — Z1231 Encounter for screening mammogram for malignant neoplasm of breast: Secondary | ICD-10-CM

## 2021-07-16 DIAGNOSIS — M65331 Trigger finger, right middle finger: Secondary | ICD-10-CM | POA: Diagnosis not present

## 2021-07-16 DIAGNOSIS — M170 Bilateral primary osteoarthritis of knee: Secondary | ICD-10-CM | POA: Diagnosis not present

## 2021-07-16 DIAGNOSIS — E7800 Pure hypercholesterolemia, unspecified: Secondary | ICD-10-CM | POA: Diagnosis not present

## 2021-07-16 DIAGNOSIS — E039 Hypothyroidism, unspecified: Secondary | ICD-10-CM | POA: Diagnosis not present

## 2021-07-16 DIAGNOSIS — Z23 Encounter for immunization: Secondary | ICD-10-CM | POA: Diagnosis not present

## 2021-07-16 DIAGNOSIS — H9311 Tinnitus, right ear: Secondary | ICD-10-CM | POA: Diagnosis not present

## 2021-07-16 DIAGNOSIS — I10 Essential (primary) hypertension: Secondary | ICD-10-CM | POA: Diagnosis not present

## 2021-07-16 DIAGNOSIS — G4700 Insomnia, unspecified: Secondary | ICD-10-CM | POA: Diagnosis not present

## 2021-07-16 DIAGNOSIS — M25541 Pain in joints of right hand: Secondary | ICD-10-CM | POA: Diagnosis not present

## 2021-07-22 ENCOUNTER — Other Ambulatory Visit: Payer: Self-pay | Admitting: Family Medicine

## 2021-07-22 ENCOUNTER — Other Ambulatory Visit: Payer: Self-pay

## 2021-07-22 ENCOUNTER — Ambulatory Visit
Admission: RE | Admit: 2021-07-22 | Discharge: 2021-07-22 | Disposition: A | Discharge status: Home / Self Care | Payer: Medicare PPO | Source: Ambulatory Visit | Attending: Family Medicine | Admitting: Family Medicine

## 2021-07-22 DIAGNOSIS — M25541 Pain in joints of right hand: Secondary | ICD-10-CM

## 2021-07-22 DIAGNOSIS — M7989 Other specified soft tissue disorders: Secondary | ICD-10-CM | POA: Diagnosis not present

## 2021-07-22 DIAGNOSIS — M1811 Unilateral primary osteoarthritis of first carpometacarpal joint, right hand: Secondary | ICD-10-CM | POA: Diagnosis not present

## 2021-07-24 DIAGNOSIS — H02834 Dermatochalasis of left upper eyelid: Secondary | ICD-10-CM | POA: Diagnosis not present

## 2021-07-24 DIAGNOSIS — H02831 Dermatochalasis of right upper eyelid: Secondary | ICD-10-CM | POA: Diagnosis not present

## 2021-07-24 DIAGNOSIS — H4089 Other specified glaucoma: Secondary | ICD-10-CM | POA: Diagnosis not present

## 2021-07-29 DIAGNOSIS — H02831 Dermatochalasis of right upper eyelid: Secondary | ICD-10-CM | POA: Diagnosis not present

## 2021-07-29 DIAGNOSIS — H4089 Other specified glaucoma: Secondary | ICD-10-CM | POA: Diagnosis not present

## 2021-07-29 DIAGNOSIS — H02834 Dermatochalasis of left upper eyelid: Secondary | ICD-10-CM | POA: Diagnosis not present

## 2021-08-05 DIAGNOSIS — H02054 Trichiasis without entropian left upper eyelid: Secondary | ICD-10-CM | POA: Diagnosis not present

## 2021-08-05 DIAGNOSIS — H02831 Dermatochalasis of right upper eyelid: Secondary | ICD-10-CM | POA: Diagnosis not present

## 2021-08-05 DIAGNOSIS — H02834 Dermatochalasis of left upper eyelid: Secondary | ICD-10-CM | POA: Diagnosis not present

## 2021-08-05 DIAGNOSIS — H02051 Trichiasis without entropian right upper eyelid: Secondary | ICD-10-CM | POA: Diagnosis not present

## 2021-08-07 DIAGNOSIS — M170 Bilateral primary osteoarthritis of knee: Secondary | ICD-10-CM | POA: Diagnosis not present

## 2021-08-19 ENCOUNTER — Ambulatory Visit
Admission: RE | Admit: 2021-08-19 | Discharge: 2021-08-19 | Disposition: A | Discharge status: Home / Self Care | Payer: Medicare PPO | Source: Ambulatory Visit | Attending: Family Medicine | Admitting: Family Medicine

## 2021-08-19 DIAGNOSIS — Z1231 Encounter for screening mammogram for malignant neoplasm of breast: Secondary | ICD-10-CM | POA: Diagnosis not present

## 2021-10-02 DIAGNOSIS — R590 Localized enlarged lymph nodes: Secondary | ICD-10-CM | POA: Diagnosis not present

## 2021-10-29 DIAGNOSIS — M180 Bilateral primary osteoarthritis of first carpometacarpal joints: Secondary | ICD-10-CM | POA: Diagnosis not present

## 2021-10-29 DIAGNOSIS — M79641 Pain in right hand: Secondary | ICD-10-CM | POA: Diagnosis not present

## 2021-10-29 DIAGNOSIS — M79642 Pain in left hand: Secondary | ICD-10-CM | POA: Diagnosis not present

## 2021-11-03 DIAGNOSIS — M25562 Pain in left knee: Secondary | ICD-10-CM | POA: Diagnosis not present

## 2021-11-03 DIAGNOSIS — M170 Bilateral primary osteoarthritis of knee: Secondary | ICD-10-CM | POA: Diagnosis not present

## 2021-11-03 DIAGNOSIS — M25561 Pain in right knee: Secondary | ICD-10-CM | POA: Diagnosis not present

## 2021-11-11 ENCOUNTER — Other Ambulatory Visit (HOSPITAL_COMMUNITY): Payer: Medicare PPO

## 2021-11-24 ENCOUNTER — Ambulatory Visit: Admit: 2021-11-24 | Payer: Medicare PPO | Admitting: Orthopedic Surgery

## 2021-11-24 DIAGNOSIS — M1711 Unilateral primary osteoarthritis, right knee: Secondary | ICD-10-CM

## 2021-11-24 SURGERY — ARTHROPLASTY, KNEE, TOTAL
Anesthesia: Choice | Site: Knee | Laterality: Right

## 2021-12-18 DIAGNOSIS — M170 Bilateral primary osteoarthritis of knee: Secondary | ICD-10-CM | POA: Diagnosis not present

## 2022-01-06 DIAGNOSIS — H4089 Other specified glaucoma: Secondary | ICD-10-CM | POA: Diagnosis not present

## 2022-02-25 DIAGNOSIS — H40003 Preglaucoma, unspecified, bilateral: Secondary | ICD-10-CM | POA: Diagnosis not present

## 2022-03-03 DIAGNOSIS — H402212 Chronic angle-closure glaucoma, right eye, moderate stage: Secondary | ICD-10-CM | POA: Diagnosis not present

## 2022-03-03 DIAGNOSIS — Z01818 Encounter for other preprocedural examination: Secondary | ICD-10-CM | POA: Diagnosis not present

## 2022-03-03 DIAGNOSIS — H25811 Combined forms of age-related cataract, right eye: Secondary | ICD-10-CM | POA: Diagnosis not present

## 2022-03-03 DIAGNOSIS — H402232 Chronic angle-closure glaucoma, bilateral, moderate stage: Secondary | ICD-10-CM | POA: Diagnosis not present

## 2022-03-23 DIAGNOSIS — I10 Essential (primary) hypertension: Secondary | ICD-10-CM | POA: Diagnosis not present

## 2022-03-23 DIAGNOSIS — E7800 Pure hypercholesterolemia, unspecified: Secondary | ICD-10-CM | POA: Diagnosis not present

## 2022-03-23 DIAGNOSIS — M25541 Pain in joints of right hand: Secondary | ICD-10-CM | POA: Diagnosis not present

## 2022-03-23 DIAGNOSIS — M170 Bilateral primary osteoarthritis of knee: Secondary | ICD-10-CM | POA: Diagnosis not present

## 2022-03-23 DIAGNOSIS — M65331 Trigger finger, right middle finger: Secondary | ICD-10-CM | POA: Diagnosis not present

## 2022-03-23 DIAGNOSIS — Z0000 Encounter for general adult medical examination without abnormal findings: Secondary | ICD-10-CM | POA: Diagnosis not present

## 2022-03-23 DIAGNOSIS — E039 Hypothyroidism, unspecified: Secondary | ICD-10-CM | POA: Diagnosis not present

## 2022-03-23 DIAGNOSIS — Z1331 Encounter for screening for depression: Secondary | ICD-10-CM | POA: Diagnosis not present

## 2022-03-23 DIAGNOSIS — H9311 Tinnitus, right ear: Secondary | ICD-10-CM | POA: Diagnosis not present

## 2022-03-27 ENCOUNTER — Other Ambulatory Visit: Payer: Self-pay | Admitting: Family Medicine

## 2022-03-27 DIAGNOSIS — Z1231 Encounter for screening mammogram for malignant neoplasm of breast: Secondary | ICD-10-CM

## 2022-03-30 DIAGNOSIS — H269 Unspecified cataract: Secondary | ICD-10-CM | POA: Diagnosis not present

## 2022-03-30 DIAGNOSIS — H402212 Chronic angle-closure glaucoma, right eye, moderate stage: Secondary | ICD-10-CM | POA: Diagnosis not present

## 2022-03-30 DIAGNOSIS — H25811 Combined forms of age-related cataract, right eye: Secondary | ICD-10-CM | POA: Diagnosis not present

## 2022-03-30 DIAGNOSIS — H409 Unspecified glaucoma: Secondary | ICD-10-CM | POA: Diagnosis not present

## 2022-03-30 DIAGNOSIS — H2512 Age-related nuclear cataract, left eye: Secondary | ICD-10-CM | POA: Diagnosis not present

## 2022-04-02 ENCOUNTER — Inpatient Hospital Stay (HOSPITAL_BASED_OUTPATIENT_CLINIC_OR_DEPARTMENT_OTHER)
Admission: EM | Admit: 2022-04-02 | Discharge: 2022-04-05 | DRG: 312 | Disposition: A | Discharge status: Home / Self Care | Payer: Medicare PPO | Attending: Internal Medicine | Admitting: Internal Medicine

## 2022-04-02 ENCOUNTER — Emergency Department (HOSPITAL_BASED_OUTPATIENT_CLINIC_OR_DEPARTMENT_OTHER): Payer: Medicare PPO

## 2022-04-02 ENCOUNTER — Other Ambulatory Visit: Payer: Self-pay

## 2022-04-02 DIAGNOSIS — I10 Essential (primary) hypertension: Secondary | ICD-10-CM | POA: Diagnosis present

## 2022-04-02 DIAGNOSIS — M1990 Unspecified osteoarthritis, unspecified site: Secondary | ICD-10-CM | POA: Diagnosis present

## 2022-04-02 DIAGNOSIS — Y92018 Other place in single-family (private) house as the place of occurrence of the external cause: Secondary | ICD-10-CM

## 2022-04-02 DIAGNOSIS — Z79899 Other long term (current) drug therapy: Secondary | ICD-10-CM | POA: Diagnosis not present

## 2022-04-02 DIAGNOSIS — E785 Hyperlipidemia, unspecified: Secondary | ICD-10-CM | POA: Diagnosis present

## 2022-04-02 DIAGNOSIS — Z91040 Latex allergy status: Secondary | ICD-10-CM

## 2022-04-02 DIAGNOSIS — R55 Syncope and collapse: Secondary | ICD-10-CM | POA: Diagnosis present

## 2022-04-02 DIAGNOSIS — S02611A Fracture of condylar process of right mandible, initial encounter for closed fracture: Secondary | ICD-10-CM | POA: Diagnosis not present

## 2022-04-02 DIAGNOSIS — S0301XA Dislocation of jaw, right side, initial encounter: Secondary | ICD-10-CM | POA: Diagnosis present

## 2022-04-02 DIAGNOSIS — Z882 Allergy status to sulfonamides status: Secondary | ICD-10-CM | POA: Diagnosis not present

## 2022-04-02 DIAGNOSIS — S199XXA Unspecified injury of neck, initial encounter: Secondary | ICD-10-CM | POA: Diagnosis not present

## 2022-04-02 DIAGNOSIS — Z8249 Family history of ischemic heart disease and other diseases of the circulatory system: Secondary | ICD-10-CM | POA: Diagnosis not present

## 2022-04-02 DIAGNOSIS — Z885 Allergy status to narcotic agent status: Secondary | ICD-10-CM

## 2022-04-02 DIAGNOSIS — E039 Hypothyroidism, unspecified: Secondary | ICD-10-CM | POA: Diagnosis present

## 2022-04-02 DIAGNOSIS — Z886 Allergy status to analgesic agent status: Secondary | ICD-10-CM | POA: Diagnosis not present

## 2022-04-02 DIAGNOSIS — S02621A Fracture of subcondylar process of right mandible, initial encounter for closed fracture: Secondary | ICD-10-CM | POA: Diagnosis not present

## 2022-04-02 DIAGNOSIS — S02609A Fracture of mandible, unspecified, initial encounter for closed fracture: Secondary | ICD-10-CM | POA: Diagnosis present

## 2022-04-02 DIAGNOSIS — Z79890 Hormone replacement therapy (postmenopausal): Secondary | ICD-10-CM | POA: Diagnosis not present

## 2022-04-02 DIAGNOSIS — Z9109 Other allergy status, other than to drugs and biological substances: Secondary | ICD-10-CM | POA: Diagnosis not present

## 2022-04-02 DIAGNOSIS — Z833 Family history of diabetes mellitus: Secondary | ICD-10-CM

## 2022-04-02 DIAGNOSIS — J9811 Atelectasis: Secondary | ICD-10-CM | POA: Diagnosis not present

## 2022-04-02 DIAGNOSIS — W19XXXA Unspecified fall, initial encounter: Secondary | ICD-10-CM | POA: Diagnosis present

## 2022-04-02 DIAGNOSIS — K0820 Unspecified atrophy of edentulous alveolar ridge: Secondary | ICD-10-CM | POA: Diagnosis not present

## 2022-04-02 DIAGNOSIS — S0993XA Unspecified injury of face, initial encounter: Secondary | ICD-10-CM | POA: Diagnosis not present

## 2022-04-02 DIAGNOSIS — E876 Hypokalemia: Secondary | ICD-10-CM | POA: Diagnosis present

## 2022-04-02 DIAGNOSIS — R0789 Other chest pain: Secondary | ICD-10-CM | POA: Diagnosis not present

## 2022-04-02 DIAGNOSIS — R519 Headache, unspecified: Secondary | ICD-10-CM | POA: Diagnosis not present

## 2022-04-02 LAB — BASIC METABOLIC PANEL
Anion gap: 8 (ref 5–15)
BUN: 13 mg/dL (ref 8–23)
CO2: 27 mmol/L (ref 22–32)
Calcium: 11.1 mg/dL — ABNORMAL HIGH (ref 8.9–10.3)
Chloride: 102 mmol/L (ref 98–111)
Creatinine, Ser: 0.8 mg/dL (ref 0.44–1.00)
GFR, Estimated: >60 mL/min (ref >60)
Glucose, Bld: 157 mg/dL — ABNORMAL HIGH (ref 70–99)
Potassium: 3.1 mmol/L — ABNORMAL LOW (ref 3.5–5.1)
Sodium: 137 mmol/L (ref 135–145)

## 2022-04-02 LAB — CBC WITH DIFFERENTIAL/PLATELET
Abs Immature Granulocytes: 0.02 10*3/uL (ref 0.00–0.07)
Basophils Absolute: 0 10*3/uL (ref 0.0–0.1)
Basophils Relative: 0 %
Eosinophils Absolute: 0.1 10*3/uL (ref 0.0–0.5)
Eosinophils Relative: 2 %
HCT: 41 % (ref 36.0–46.0)
Hemoglobin: 13.6 g/dL (ref 12.0–15.0)
Immature Granulocytes: 0 %
Lymphocytes Relative: 42 %
Lymphs Abs: 2.6 10*3/uL (ref 0.7–4.0)
MCH: 30.2 pg (ref 26.0–34.0)
MCHC: 33.2 g/dL (ref 30.0–36.0)
MCV: 90.9 fL (ref 80.0–100.0)
Monocytes Absolute: 0.4 10*3/uL (ref 0.1–1.0)
Monocytes Relative: 7 %
Neutro Abs: 2.9 10*3/uL (ref 1.7–7.7)
Neutrophils Relative %: 49 %
Platelets: 190 10*3/uL (ref 150–400)
RBC: 4.51 MIL/uL (ref 3.87–5.11)
RDW: 12.4 % (ref 11.5–15.5)
WBC: 6 10*3/uL (ref 4.0–10.5)
nRBC: 0 % (ref 0.0–0.2)

## 2022-04-02 LAB — CBG MONITORING, ED: Glucose-Capillary: 169 mg/dL — ABNORMAL HIGH (ref 70–99)

## 2022-04-02 MED ORDER — ONDANSETRON HCL 4 MG/2ML IJ SOLN
4.0000 mg | Freq: Once | INTRAMUSCULAR | Status: AC
  Administered 2022-04-02: 4 mg via INTRAVENOUS
  Filled 2022-04-02: qty 2

## 2022-04-02 MED ORDER — FENTANYL CITRATE PF 50 MCG/ML IJ SOSY
50.0000 ug | PREFILLED_SYRINGE | Freq: Once | INTRAMUSCULAR | Status: AC
  Administered 2022-04-02: 50 ug via INTRAVENOUS
  Filled 2022-04-02: qty 1

## 2022-04-02 NOTE — ED Notes (Signed)
CT informed pt in subwaiting area. Pending CT exams. Son and pt updated regarding pending room assignment

## 2022-04-02 NOTE — ED Notes (Signed)
Pt was seen by EDP [place] while in triage. Verbal order for CT scans give and CBC with diff.

## 2022-04-02 NOTE — ED Triage Notes (Signed)
Pt states she was sitting in a chair, went to stand up, began to feel dizzy, and called out for son. Unable to recall events following. + LOC Son states finding her on the floor laying on her left side. C/o right side jaw pain and upper chest pain. Son states pt is not acting like herself and is a "sluggish."  C collar was placed in triage. A&Ox4. No blood thinners.

## 2022-04-03 DIAGNOSIS — Z9109 Other allergy status, other than to drugs and biological substances: Secondary | ICD-10-CM | POA: Diagnosis not present

## 2022-04-03 DIAGNOSIS — Z833 Family history of diabetes mellitus: Secondary | ICD-10-CM | POA: Diagnosis not present

## 2022-04-03 DIAGNOSIS — Z8249 Family history of ischemic heart disease and other diseases of the circulatory system: Secondary | ICD-10-CM | POA: Diagnosis not present

## 2022-04-03 DIAGNOSIS — R55 Syncope and collapse: Secondary | ICD-10-CM | POA: Diagnosis present

## 2022-04-03 DIAGNOSIS — Z79890 Hormone replacement therapy (postmenopausal): Secondary | ICD-10-CM | POA: Diagnosis not present

## 2022-04-03 DIAGNOSIS — E785 Hyperlipidemia, unspecified: Secondary | ICD-10-CM | POA: Diagnosis not present

## 2022-04-03 DIAGNOSIS — Y92018 Other place in single-family (private) house as the place of occurrence of the external cause: Secondary | ICD-10-CM | POA: Diagnosis not present

## 2022-04-03 DIAGNOSIS — S02609A Fracture of mandible, unspecified, initial encounter for closed fracture: Secondary | ICD-10-CM

## 2022-04-03 DIAGNOSIS — S0301XA Dislocation of jaw, right side, initial encounter: Secondary | ICD-10-CM | POA: Diagnosis not present

## 2022-04-03 DIAGNOSIS — Z79899 Other long term (current) drug therapy: Secondary | ICD-10-CM | POA: Diagnosis not present

## 2022-04-03 DIAGNOSIS — I10 Essential (primary) hypertension: Secondary | ICD-10-CM | POA: Diagnosis not present

## 2022-04-03 DIAGNOSIS — E876 Hypokalemia: Secondary | ICD-10-CM | POA: Diagnosis present

## 2022-04-03 DIAGNOSIS — E039 Hypothyroidism, unspecified: Secondary | ICD-10-CM | POA: Diagnosis not present

## 2022-04-03 DIAGNOSIS — W19XXXA Unspecified fall, initial encounter: Secondary | ICD-10-CM | POA: Diagnosis not present

## 2022-04-03 DIAGNOSIS — Z882 Allergy status to sulfonamides status: Secondary | ICD-10-CM | POA: Diagnosis not present

## 2022-04-03 DIAGNOSIS — M1990 Unspecified osteoarthritis, unspecified site: Secondary | ICD-10-CM | POA: Diagnosis not present

## 2022-04-03 DIAGNOSIS — Z886 Allergy status to analgesic agent status: Secondary | ICD-10-CM | POA: Diagnosis not present

## 2022-04-03 DIAGNOSIS — Z91040 Latex allergy status: Secondary | ICD-10-CM | POA: Diagnosis not present

## 2022-04-03 DIAGNOSIS — Z885 Allergy status to narcotic agent status: Secondary | ICD-10-CM | POA: Diagnosis not present

## 2022-04-03 LAB — BASIC METABOLIC PANEL
Anion gap: 6 (ref 5–15)
BUN: 6 mg/dL — ABNORMAL LOW (ref 8–23)
CO2: 25 mmol/L (ref 22–32)
Calcium: 10.2 mg/dL (ref 8.9–10.3)
Chloride: 109 mmol/L (ref 98–111)
Creatinine, Ser: 0.66 mg/dL (ref 0.44–1.00)
GFR, Estimated: >60 mL/min (ref >60)
Glucose, Bld: 136 mg/dL — ABNORMAL HIGH (ref 70–99)
Potassium: 3.5 mmol/L (ref 3.5–5.1)
Sodium: 140 mmol/L (ref 135–145)

## 2022-04-03 LAB — URINALYSIS, ROUTINE W REFLEX MICROSCOPIC
Bilirubin Urine: NEGATIVE
Glucose, UA: NEGATIVE mg/dL
Hgb urine dipstick: NEGATIVE
Ketones, ur: NEGATIVE mg/dL
Nitrite: NEGATIVE
Protein, ur: NEGATIVE mg/dL
Specific Gravity, Urine: 1.018 (ref 1.005–1.030)
pH: 7 (ref 5.0–8.0)

## 2022-04-03 MED ORDER — FENTANYL CITRATE PF 50 MCG/ML IJ SOSY
50.0000 ug | PREFILLED_SYRINGE | INTRAMUSCULAR | Status: DC | PRN
  Administered 2022-04-03 (×3): 50 ug via INTRAVENOUS
  Filled 2022-04-03 (×3): qty 1

## 2022-04-03 MED ORDER — POTASSIUM CHLORIDE CRYS ER 10 MEQ PO TBCR: 10.0000 meq | EXTENDED_RELEASE_TABLET | Freq: Every day | ORAL | Status: DC

## 2022-04-03 MED ORDER — SODIUM CHLORIDE 0.9 % IV BOLUS
500.0000 mL | Freq: Once | INTRAVENOUS | Status: AC
  Administered 2022-04-03: 500 mL via INTRAVENOUS

## 2022-04-03 MED ORDER — AMLODIPINE BESYLATE 2.5 MG PO TABS
2.5000 mg | ORAL_TABLET | Freq: Every day | ORAL | Status: DC
  Administered 2022-04-03: 2.5 mg via ORAL
  Filled 2022-04-03: qty 1

## 2022-04-03 MED ORDER — HYDROCHLOROTHIAZIDE 25 MG PO TABS: 25.0000 mg | ORAL_TABLET | Freq: Every day | ORAL | Status: DC

## 2022-04-03 MED ORDER — EZETIMIBE 10 MG PO TABS
10.0000 mg | ORAL_TABLET | Freq: Every day | ORAL | Status: DC
  Administered 2022-04-04: 10 mg via ORAL
  Filled 2022-04-03: qty 1

## 2022-04-03 MED ORDER — PREDNISOLONE ACETATE 1 % OP SUSP
1.0000 [drp] | Freq: Two times a day (BID) | OPHTHALMIC | Status: DC
  Filled 2022-04-03: qty 5

## 2022-04-03 MED ORDER — ACETAMINOPHEN 325 MG PO TABS
650.0000 mg | ORAL_TABLET | Freq: Four times a day (QID) | ORAL | Status: DC | PRN
  Administered 2022-04-04: 325 mg via ORAL
  Filled 2022-04-03: qty 2

## 2022-04-03 MED ORDER — ROSUVASTATIN CALCIUM 5 MG PO TABS
5.0000 mg | ORAL_TABLET | ORAL | Status: DC
  Administered 2022-04-04: 5 mg via ORAL
  Filled 2022-04-03: qty 1

## 2022-04-03 MED ORDER — IBUPROFEN 200 MG PO TABS: 200.0000 mg | ORAL_TABLET | Freq: Four times a day (QID) | ORAL | Status: DC | PRN

## 2022-04-03 MED ORDER — PREDNISOLONE ACETATE 1 % OP SUSP: 1.0000 [drp] | OPHTHALMIC | Status: DC

## 2022-04-03 MED ORDER — ETODOLAC 400 MG PO TABS
200.0000 mg | ORAL_TABLET | Freq: Every day | ORAL | Status: DC
  Filled 2022-04-03: qty 0.5

## 2022-04-03 MED ORDER — TIMOLOL MALEATE 0.5 % OP SOLN
1.0000 [drp] | Freq: Every day | OPHTHALMIC | Status: DC
  Administered 2022-04-04 – 2022-04-05 (×2): 1 [drp] via OPHTHALMIC
  Filled 2022-04-03: qty 5

## 2022-04-03 MED ORDER — LEVOTHYROXINE SODIUM 50 MCG PO TABS
50.0000 ug | ORAL_TABLET | Freq: Every day | ORAL | Status: DC
  Administered 2022-04-05: 50 ug via ORAL
  Filled 2022-04-03 (×2): qty 1

## 2022-04-03 MED ORDER — FENTANYL CITRATE PF 50 MCG/ML IJ SOSY
50.0000 ug | PREFILLED_SYRINGE | INTRAMUSCULAR | Status: DC | PRN
  Administered 2022-04-04 (×2): 50 ug via INTRAVENOUS
  Filled 2022-04-03 (×2): qty 1

## 2022-04-03 MED ORDER — LORATADINE 10 MG PO TABS: 10.0000 mg | ORAL_TABLET | Freq: Every day | ORAL | Status: DC

## 2022-04-03 MED ORDER — PREDNISOLONE ACETATE 1 % OP SUSP
1.0000 [drp] | Freq: Three times a day (TID) | OPHTHALMIC | Status: DC
Start: 2022-04-03 — End: 2022-04-05
  Administered 2022-04-03 – 2022-04-05 (×6): 1 [drp] via OPHTHALMIC
  Filled 2022-04-03: qty 5

## 2022-04-03 MED ORDER — PREDNISOLONE ACETATE 1 % OP SUSP
1.0000 [drp] | Freq: Every day | OPHTHALMIC | Status: DC
  Filled 2022-04-03: qty 5

## 2022-04-03 MED ORDER — ETODOLAC 200 MG PO CAPS
200.0000 mg | ORAL_CAPSULE | Freq: Every day | ORAL | Status: DC
  Filled 2022-04-03 (×2): qty 1

## 2022-04-03 MED ORDER — POTASSIUM CHLORIDE 20 MEQ PO PACK
40.0000 meq | PACK | Freq: Every day | ORAL | Status: DC
  Administered 2022-04-03: 40 meq via ORAL
  Filled 2022-04-03: qty 2

## 2022-04-03 NOTE — Assessment & Plan Note (Deleted)
-  No formal orthostatic vital signs was taken in ED but based on varies readings in different positions while she was there did not reflect orthostatic hypotension -likely vasovagal for GI symptoms -will keep on continuous telemetry and obtain echocardiogram

## 2022-04-03 NOTE — Assessment & Plan Note (Signed)
Continue statin and zetia ?

## 2022-04-03 NOTE — ED Notes (Signed)
Per consult with ED MD K given

## 2022-04-03 NOTE — Assessment & Plan Note (Signed)
Presenting with K of 3.1.  This is now resolved following oral potassium supplementation in the ED.

## 2022-04-03 NOTE — Assessment & Plan Note (Signed)
Mildly elevated at 11 on presented. Resolved with fluid bolus in ED.

## 2022-04-03 NOTE — Plan of Care (Signed)
Name: Gabrielle [name] [facility] 1946/02/28 MR# [id number] Dr Read Drivers from [facility]  Patient is 76 years old woman with history of hypertension, hypothyroidism who came to ED with complaint of dizziness, syncopal event.  No reported chest pain, palpitation, shortness of breath.  ED did not check orthostatics on arrival, but they will document.  Work-up showed potassium 3.1, calcium 11.1. CT cervical spine, no fractures CT maxillary, show displaced right mandibular fracture  ED discussed case with ENT on-call Dr. Elijah Birk, who recommended admission to medicine and they will consult on arrival. ENT wants to be called when patient arrives to hospital.  Given IV fluids, needs ongoing telemetry monitoring, echocardiogram.  Etc. Might need surgical intervention for mandibular fracture.  Request for admission to medical  telemetry for syncope and collapse, as well as multiple fracture  Dr Olevia Bowens

## 2022-04-03 NOTE — ED Provider Notes (Signed)
[facility] EMERGENCY Provider Note: Gabrielle Dell, MD, FACEP  CSN: [id number] MRN: [id number] ARRIVAL: 04/02/22 at 2227 ROOM: DB013/DB013   CHIEF COMPLAINT  Syncope   HISTORY OF PRESENT ILLNESS  04/02/22 patient seen in triage on arrival MELI FALEY is a 76 y.o. female who was sitting in a chair just prior to arrival.  She went to stand up and began to feel lightheaded and called out for her son.  She then had a syncopal episode after which her son found her lying on the floor on her left side.  She is complaining of right-sided jaw pain and upper chest pain.  Her son states her right jaw looks swollen.  She was placed in a c-collar in triage.  She is not on anticoagulation.  She rates her jaw pain as a 9 out of 10, worse with movement.   Past Medical History:  Diagnosis Date   Hypertension    Thyroid disease     Past Surgical History:  Procedure Laterality Date   ABDOMINAL HYSTERECTOMY     BREAST EXCISIONAL BIOPSY Left    SHOULDER SURGERY      Family History  Problem Relation Age of Onset   Diabetes Father    Cancer Father    Hypertension Father     Social History   Tobacco Use   Smoking status: Never   Smokeless tobacco: Never  Substance Use Topics   Alcohol use: No   Drug use: Never    Prior to Admission medications   Medication Sig Start Date End Date Taking? Authorizing Provider  hydrochlorothiazide (HYDRODIURIL) 25 MG tablet Take 25 mg by mouth daily.    [provider]  levothyroxine (SYNTHROID, LEVOTHROID) 50 MCG tablet Take 50 mcg by mouth daily.    [provider]  nebivolol (BYSTOLIC) 10 MG tablet Take 10 mg by mouth daily.    [provider]  omeprazole (PRILOSEC) 40 MG capsule Take 40 mg by mouth daily.    [provider]  potassium chloride (K-DUR,KLOR-CON) 10 MEQ tablet Take 10 mEq by mouth 2 (two) times daily.    [provider]  ranitidine (ZANTAC) 300 MG tablet Take 300 mg by mouth at bedtime.     [provider]  simvastatin (ZOCOR) 20 MG tablet Take 20 mg by mouth every evening.    [provider]    Allergies Codeine, Latex, Lidocaine, and Sulfa antibiotics   REVIEW OF SYSTEMS  Negative except as noted here or in the History of Present Illness.   PHYSICAL EXAMINATION  Initial Vital Signs Blood pressure (!) 143/77, pulse 63, temperature 98.6 F (37 C), resp. rate 14, height 5' 4.5" (1.638 m), weight 74.8 kg, SpO2 94 %.  Examination General: Well-developed, well-nourished female in no acute distress; appearance consistent with age of record HENT: normocephalic; right mandibular tenderness and swelling Eyes: pupils equal, round and reactive to light; extraocular muscles intact Neck: supple; nontender Heart: regular rate and rhythm; no murmurs, rubs or gallops Lungs: clear to auscultation bilaterally Abdomen: soft; nondistended; nontender; no masses or hepatosplenomegaly; bowel sounds present Extremities: No deformity; full range of motion; pulses normal Neurologic: Awake, alert and oriented; motor function intact in all extremities and symmetric; no facial droop Skin: Warm and dry Psychiatric: Normal mood and affect   RESULTS  Summary of this visit's results, reviewed and interpreted by myself:   EKG Interpretation  Date/Time:  Thursday April 02 2022 22:38:53 EDT Ventricular Rate:  63 PR Interval:  180 QRS Duration:  86 QT Interval:  404 QTC Calculation: 413 R Axis:   49 Text Interpretation: Normal sinus rhythm No significant change was found Confirmed by Yamari Ventola, Jonny Ruiz ([id number]) on 04/02/2022 10:42:42 PM       Laboratory Studies: Results for orders placed or performed during the hospital encounter of 04/02/22 (from the past 24 hour(s))  CBG monitoring, ED     Status: Abnormal   Collection Time: 04/02/22 10:46 PM  Result Value Ref Range   Glucose-Capillary 169 (H) 70 - 99 mg/dL  Basic metabolic panel     Status: Abnormal   Collection Time:  04/02/22 10:52 PM  Result Value Ref Range   Sodium 137 135 - 145 mmol/L   Potassium 3.1 (L) 3.5 - 5.1 mmol/L   Chloride 102 98 - 111 mmol/L   CO2 27 22 - 32 mmol/L   Glucose, Bld 157 (H) 70 - 99 mg/dL   BUN 13 8 - 23 mg/dL   Creatinine, Ser 6.73 0.44 - 1.00 mg/dL   Calcium 41.9 (H) 8.9 - 10.3 mg/dL   GFR, Estimated >37 >90 mL/min   Anion gap 8 5 - 15  CBC with Differential     Status: None   Collection Time: 04/02/22 10:53 PM  Result Value Ref Range   WBC 6.0 4.0 - 10.5 K/uL   RBC 4.51 3.87 - 5.11 MIL/uL   Hemoglobin 13.6 12.0 - 15.0 g/dL   HCT 24.0 97.3 - 53.2 %   MCV 90.9 80.0 - 100.0 fL   MCH 30.2 26.0 - 34.0 pg   MCHC 33.2 30.0 - 36.0 g/dL   RDW 99.2 42.6 - 83.4 %   Platelets 190 150 - 400 K/uL   nRBC 0.0 0.0 - 0.2 %   Neutrophils Relative % 49 %   Neutro Abs 2.9 1.7 - 7.7 K/uL   Lymphocytes Relative 42 %   Lymphs Abs 2.6 0.7 - 4.0 K/uL   Monocytes Relative 7 %   Monocytes Absolute 0.4 0.1 - 1.0 K/uL   Eosinophils Relative 2 %   Eosinophils Absolute 0.1 0.0 - 0.5 K/uL   Basophils Relative 0 %   Basophils Absolute 0.0 0.0 - 0.1 K/uL   Immature Granulocytes 0 %   Abs Immature Granulocytes 0.02 0.00 - 0.07 K/uL   Imaging Studies: CT Cervical Spine Wo Contrast  Result Date: 04/02/2022 CLINICAL DATA:  Facial trauma, blunt EXAM: CT CERVICAL SPINE WITHOUT CONTRAST TECHNIQUE: Multidetector CT imaging of the cervical spine was performed without intravenous contrast. Multiplanar CT image reconstructions were also generated. RADIATION DOSE REDUCTION: This exam was performed according to the departmental dose-optimization program which includes automated exposure control, adjustment of the mA and/or kV according to patient size and/or use of iterative reconstruction technique. COMPARISON:  Face CT today FINDINGS: Alignment: Normal Skull base and vertebrae: No acute fracture. No primary bone lesion or focal pathologic process. Soft tissues and spinal canal: No prevertebral fluid or  swelling. No visible canal hematoma. Disc levels: Degenerative disc disease with disc space narrowing and spurring from C4-5 through C6-7. Diffuse bilateral degenerative facet disease. Upper chest: No acute findings Other: Right mandibular fracture partially imaged. See face CT report. IMPRESSION: No acute bony abnormality in the cervical spine. Electronically Signed   By: Charlett Nose M.D.   On: 04/02/2022 23:16   CT Maxillofacial Wo Contrast  Result Date: 04/02/2022 CLINICAL DATA:  Right jaw pain.  Facial trauma, blunt EXAM: CT MAXILLOFACIAL WITHOUT CONTRAST TECHNIQUE: Multidetector CT imaging of the maxillofacial structures was performed. Multiplanar  CT image reconstructions were also generated. RADIATION DOSE REDUCTION: This exam was performed according to the departmental dose-optimization program which includes automated exposure control, adjustment of the mA and/or kV according to patient size and/or use of iterative reconstruction technique. COMPARISON:  None Available. FINDINGS: Osseous: There is a right mandibular neck fracture with angulation. Nondisplaced fracture at the base of the coronoid process. Dislocation of the mandibular head anteriorly. No additional facial fracture. Orbits: No orbital fracture.  Globes intact. Sinuses: Clear Soft tissues: Negative Limited intracranial: No significant or unexpected finding. IMPRESSION: Right mandible fracture through the mandibular neck with anterior dislocation of the mandibular head relative to the temporomandibular joint. Nondisplaced fracture through the base of the right mandibular coronoid process. Electronically Signed   By: Charlett Nose M.D.   On: 04/02/2022 23:14    ED COURSE and MDM  Nursing notes, initial and subsequent vitals signs, including pulse oximetry, reviewed and interpreted by myself.  Vitals:   04/02/22 2323 04/03/22 0005 04/03/22 0030 04/03/22 0100  BP: 132/69 (!) 143/77 (!) 145/74 (!) 150/78  Pulse: 63 63 62 66  Resp: 17 14  15 14   Temp:      SpO2: 98% 94% 95% 98%  Weight:      Height:       Medications  potassium chloride (KLOR-CON) packet 40 mEq (has no administration in time range)  ondansetron (ZOFRAN) injection 4 mg (4 mg Intravenous Given 04/02/22 2358)  fentaNYL (SUBLIMAZE) injection 50 mcg (50 mcg Intravenous Given 04/02/22 2359)   12:58 AM The cause of the patient's syncopal episode is unclear.  We will have her admitted to the hospitalist service.  Dr. 04/04/22 of ENT consulted for the patient's mandibular fracture.  He will see the patient and the hospital once the patient is admitted.    PROCEDURES  Procedures   ED DIAGNOSES     ICD-10-CM   1. Syncope and collapse  R55     2. Closed fracture of right side of mandible, unspecified mandibular site, initial encounter (HCC)  S02.609A     3. Hypokalemia  E87.6          Vayla Wilhelmi, Elijah Birk, MD 04/03/22 [phone]

## 2022-04-03 NOTE — Assessment & Plan Note (Signed)
Continue home HCTZ, amlodipine

## 2022-04-03 NOTE — ED Notes (Signed)
Called ENT Dr. Elijah Birk and paged the hospitalist to Dr. Read Drivers

## 2022-04-03 NOTE — Assessment & Plan Note (Signed)
Right mandibular fracture with anterior dislocation of the mandibular head relative to the TMJ. -ENT Dr. Elijah Birk was consulted by ED physician. No urgent surgery needed so will touch base with ENT in the morning for formal consult. Keep NPO after midnight.  -Soft diet for now

## 2022-04-03 NOTE — H&P (Signed)
History and Physical    Patient: Gabrielle [name] UJW:[id number] DOB: 11-16-1945 DOA: 04/02/2022 DOS: the patient was seen and examined on 04/03/2022 PCP: Elias Else, MD  Patient coming from: [facility]  Chief Complaint:  Chief Complaint  Patient presents with   Syncope   HPI: Gabrielle [name] is a 76 y.o. female with medical history significant of osteoarthritis, hypertension, hypothyroidism and hyperlipidemia who presents with syncope with loss of consciousness.  She has sat down the cough last night after feeling sleepy following eating some fish. When she went to stand up, suddenly felt unwell and called out to son who lives with her. She has already lost consciousness when he found her and noted swelling to her right jaw. She believes her stomach did not feel good right before. No headache. No chest pain or palpitations.    In the ED, she had hypokalemia of 3.1, calcium of 11, BG of 157, normal creatinine of 0.8.  CBC is unremarkable.  UA is negative.  EKG on my review also shows normal sinus rhythm.  CT cervical spine showed no acute fracture.  CT maxillary showed displaced right mandibular fracture.  ED discussed case with ENT on-call Dr. Elijah Birk and they recommended admission to medicine and be transferred.  Review of Systems: As mentioned in the history of present illness. All other systems reviewed and are negative. Past Medical History:  Diagnosis Date   Hypertension    Thyroid disease    Past Surgical History:  Procedure Laterality Date   ABDOMINAL HYSTERECTOMY     BREAST EXCISIONAL BIOPSY Left    SHOULDER SURGERY     Social History:  reports that she has never smoked. She has never used smokeless tobacco. She reports that she does not drink alcohol and does not use drugs.  Allergies  Allergen Reactions   Lidocaine Swelling and Other (See Comments)    Twitching, facial swelling, and shaking     Aspirin Other (See Comments)    Increases heart  rate    Hylan G-F 20 Swelling and Other (See Comments)    Decreased facial feeling and chills- also   Lisinopril Other (See Comments) and Cough    Dry cough   Losartan Other (See Comments)    Dizziness/headaches   Metoprolol Other (See Comments)    Dizziness   Olmesartan Other (See Comments)    Hair loss   Codeine Hives, Rash and Other (See Comments)    Hallucinations and syncope, also   Latex Rash   Sulfa Antibiotics Hives and Rash    Family History  Problem Relation Age of Onset   Diabetes Father    Cancer Father    Hypertension Father     Prior to Admission medications   Medication Sig Start Date End Date Taking? Authorizing Provider  amLODipine (NORVASC) 2.5 MG tablet Take 2.5 mg by mouth at bedtime.   Yes [provider]  Cholecalciferol (VITAMIN D3 PO) Take 1 capsule by mouth daily.   Yes [provider]  Cod Liver Oil (COD LIVER PO) Take 1 capsule by mouth daily.   Yes [provider]  Coenzyme Q10 (CO Q 10 PO) Take 1 capsule by mouth daily.   Yes [provider]  COLLAGEN PO Take 1 capsule by mouth 3 (three) times a week.   Yes [provider]  Cyanocobalamin (VITAMIN B12 PO) Take 1 tablet by mouth daily.   Yes [provider]  diclofenac Sodium (VOLTAREN) 1 % GEL Apply 2  g topically 4 (four) times daily as needed (for pain- shoulders, hands, and knees).   Yes [provider]  etodolac (LODINE) 400 MG tablet Take 200 mg by mouth at bedtime.   Yes [provider]  ezetimibe (ZETIA) 10 MG tablet Take 10 mg by mouth daily.   Yes [provider]  hydrochlorothiazide (HYDRODIURIL) 25 MG tablet Take 25 mg by mouth daily.   Yes [provider]  Ibuprofen 200 MG CAPS Take 200 mg by mouth every 6 (six) hours as needed (for pain, if not taking Etodolac).   Yes [provider]  Iron-Vitamins (GERITOL COMPLETE) TABS Take 1 tablet by mouth daily with breakfast.   Yes [provider]  levothyroxine (SYNTHROID, LEVOTHROID) 50 MCG tablet Take 50 mcg by mouth daily before breakfast.   Yes [provider]  NON FORMULARY Take 1 tablet by mouth See admin instructions. Magnesium/Zinc/Vitamin C tablets- Take 1 tablet by mouth once a day   Yes [provider]  potassium chloride (K-DUR,KLOR-CON) 10 MEQ tablet Take 10 mEq by mouth daily.   Yes [provider]  prednisoLONE acetate (PRED FORTE) 1 % ophthalmic suspension Place 1 drop into the right eye See admin instructions. Place 1 drop into the right eye four times daily for 1 week, then three times daily for 1 week, then two times daily for 1 week, then one time daily for 1 week, then stop 03/26/22 05/01/22 Yes [provider]  rosuvastatin (CRESTOR) 5 MG tablet Take 5 mg by mouth every Saturday.   Yes [provider]  timolol (TIMOPTIC) 0.5 % ophthalmic solution Place 1 drop into both eyes daily.   Yes [provider]  ZYRTEC ALLERGY 10 MG tablet Take 10 mg by mouth daily.   Yes [provider]    Physical Exam: Vitals:   04/03/22 1500 04/03/22 1849 04/03/22 1851 04/03/22 2147  BP: (!) 138/104 112/69  116/70  Pulse: (!) 57 (!) 54  (!) 48  Resp: (!) 24 17  18   Temp:   98.3 F (36.8 C) 98.2 F (36.8 C)  TempSrc:   Oral   SpO2: 94% 99%  97%  Weight:      Height:       Constitutional: NAD, calm, comfortable, elderly female appearing much younger than stated age laying upright in bed Eyes: lids and conjunctivae normal ENMT: Mucous membranes are moist.  Diffuse edema of the right TMJ and mandibular.  Able to only open mouth about 2 finger width. Neck: normal, supple Respiratory: clear to auscultation bilaterally, no wheezing, no crackles. Normal respiratory effort.  Cardiovascular: Regular rate and rhythm, no murmurs / rubs / gallops. No extremity edema. 2+ pedal pulses. No carotid bruits.  Abdomen: no tenderness, no masses palpated. . Bowel sounds  positive.  Musculoskeletal: no clubbing / cyanosis. No joint deformity upper and lower extremities. Good ROM, no contractures. Normal muscle tone.  Skin: no rashes, lesions, ulcers. No induration Neurologic: CN 2-12 grossly intact. Strength 5/5 in all 4.  Psychiatric: Normal judgment and insight. Alert and oriented x 3. Normal mood. Data Reviewed:  See HPI  Assessment and Plan: * Syncope and collapse -No formal orthostatic vital signs was taken in ED but based on varies readings in different positions while she was there did not reflect orthostatic hypotension -likely vasovagal for GI symptoms -will keep on continuous telemetry and obtain echocardiogram  Mandibular fracture, closed (HCC) Right mandibular fracture with anterior dislocation of the mandibular head relative to the  TMJ. -ENT Dr. Elijah Birk was consulted by ED physician. No urgent surgery needed so will touch base with ENT in the morning for formal consult. Keep NPO after midnight.  -Soft diet for now  Hyperlipidemia Continue statin and zetia  Hypercalcemia Mildly elevated at 11 on presented. Resolved with fluid bolus in ED.  Hypokalemia Presenting with K of 3.1.  This is now resolved following oral potassium supplementation in the ED.  HTN (hypertension) Continue home HCTZ, amlodipine      Advance Care Planning: FULL CODE  Consults: ENT  Family Communication: discussed with son and nephew at bedside  Severity of Illness: The appropriate patient status for this patient is OBSERVATION. Observation status is judged to be reasonable and necessary in order to provide the required intensity of service to ensure the patient's safety. The patient's presenting symptoms, physical exam findings, and initial radiographic and laboratory data in the context of their medical condition is felt to place them at decreased risk for further clinical deterioration. Furthermore, it is anticipated that the patient will be medically  stable for discharge from the hospital within 2 midnights of admission.   Author: Anselm Jungling, DO 04/03/2022 9:59 PM  For on call review [email or web address].

## 2022-04-03 NOTE — ED Notes (Signed)
RT called to room by pts family for SOB. Pt Bilat BS clear uppers diminished lowers, spo2 100% on RA, no increased WOB noted at this time. Pt respiratory status stable w/no distress noted. RT will continue to monitor.

## 2022-04-04 ENCOUNTER — Inpatient Hospital Stay (HOSPITAL_COMMUNITY): Payer: Medicare PPO

## 2022-04-04 ENCOUNTER — Observation Stay (HOSPITAL_COMMUNITY): Payer: Medicare PPO

## 2022-04-04 ENCOUNTER — Other Ambulatory Visit (HOSPITAL_COMMUNITY): Payer: Medicare PPO

## 2022-04-04 DIAGNOSIS — Z79899 Other long term (current) drug therapy: Secondary | ICD-10-CM | POA: Diagnosis not present

## 2022-04-04 DIAGNOSIS — R55 Syncope and collapse: Secondary | ICD-10-CM

## 2022-04-04 DIAGNOSIS — E785 Hyperlipidemia, unspecified: Secondary | ICD-10-CM | POA: Diagnosis present

## 2022-04-04 DIAGNOSIS — E876 Hypokalemia: Secondary | ICD-10-CM | POA: Diagnosis present

## 2022-04-04 DIAGNOSIS — W19XXXA Unspecified fall, initial encounter: Secondary | ICD-10-CM | POA: Diagnosis present

## 2022-04-04 DIAGNOSIS — S02621A Fracture of subcondylar process of right mandible, initial encounter for closed fracture: Secondary | ICD-10-CM | POA: Diagnosis not present

## 2022-04-04 DIAGNOSIS — S02609A Fracture of mandible, unspecified, initial encounter for closed fracture: Secondary | ICD-10-CM | POA: Diagnosis present

## 2022-04-04 DIAGNOSIS — I10 Essential (primary) hypertension: Secondary | ICD-10-CM | POA: Diagnosis present

## 2022-04-04 DIAGNOSIS — Z882 Allergy status to sulfonamides status: Secondary | ICD-10-CM | POA: Diagnosis not present

## 2022-04-04 DIAGNOSIS — K0820 Unspecified atrophy of edentulous alveolar ridge: Secondary | ICD-10-CM | POA: Diagnosis not present

## 2022-04-04 DIAGNOSIS — Z885 Allergy status to narcotic agent status: Secondary | ICD-10-CM | POA: Diagnosis not present

## 2022-04-04 DIAGNOSIS — S0301XA Dislocation of jaw, right side, initial encounter: Secondary | ICD-10-CM | POA: Diagnosis present

## 2022-04-04 DIAGNOSIS — E039 Hypothyroidism, unspecified: Secondary | ICD-10-CM | POA: Diagnosis present

## 2022-04-04 DIAGNOSIS — Z79890 Hormone replacement therapy (postmenopausal): Secondary | ICD-10-CM | POA: Diagnosis not present

## 2022-04-04 DIAGNOSIS — Z886 Allergy status to analgesic agent status: Secondary | ICD-10-CM | POA: Diagnosis not present

## 2022-04-04 DIAGNOSIS — Z8249 Family history of ischemic heart disease and other diseases of the circulatory system: Secondary | ICD-10-CM | POA: Diagnosis not present

## 2022-04-04 DIAGNOSIS — M1990 Unspecified osteoarthritis, unspecified site: Secondary | ICD-10-CM | POA: Diagnosis present

## 2022-04-04 DIAGNOSIS — Y92018 Other place in single-family (private) house as the place of occurrence of the external cause: Secondary | ICD-10-CM | POA: Diagnosis not present

## 2022-04-04 DIAGNOSIS — Z9109 Other allergy status, other than to drugs and biological substances: Secondary | ICD-10-CM | POA: Diagnosis not present

## 2022-04-04 DIAGNOSIS — Z833 Family history of diabetes mellitus: Secondary | ICD-10-CM | POA: Diagnosis not present

## 2022-04-04 DIAGNOSIS — Z91040 Latex allergy status: Secondary | ICD-10-CM | POA: Diagnosis not present

## 2022-04-04 LAB — ECHOCARDIOGRAM COMPLETE
Area-P 1/2: 2.51 cm2
Height: 64.5 in
S' Lateral: 2.4 cm
Weight: 2640 oz

## 2022-04-04 LAB — SURGICAL PCR SCREEN
MRSA, PCR: NEGATIVE
Staphylococcus aureus: NEGATIVE

## 2022-04-04 MED ORDER — BISACODYL 10 MG RE SUPP
10.0000 mg | Freq: Once | RECTAL | Status: AC
  Administered 2022-04-04: 10 mg via RECTAL
  Filled 2022-04-04: qty 1

## 2022-04-04 MED ORDER — ONDANSETRON HCL 4 MG/2ML IJ SOLN
4.0000 mg | Freq: Four times a day (QID) | INTRAMUSCULAR | Status: DC | PRN
Start: 2022-04-04 — End: 2022-04-05
  Administered 2022-04-05: 4 mg via INTRAVENOUS
  Filled 2022-04-04: qty 2

## 2022-04-04 MED ORDER — HYDROCODONE-ACETAMINOPHEN 5-325 MG PO TABS: 1.0000 | ORAL_TABLET | Freq: Four times a day (QID) | ORAL | Status: DC | PRN

## 2022-04-04 MED ORDER — DICLOFENAC SODIUM 1 % EX GEL
2.0000 g | Freq: Four times a day (QID) | CUTANEOUS | Status: DC
  Administered 2022-04-04 (×2): 2 g via TOPICAL
  Filled 2022-04-04: qty 100

## 2022-04-04 MED ORDER — KETOROLAC TROMETHAMINE 15 MG/ML IJ SOLN
15.0000 mg | Freq: Four times a day (QID) | INTRAMUSCULAR | Status: DC | PRN
  Administered 2022-04-05: 15 mg via INTRAVENOUS
  Filled 2022-04-04: qty 1

## 2022-04-04 MED ORDER — POLYETHYLENE GLYCOL 3350 17 G PO PACK
17.0000 g | PACK | Freq: Every day | ORAL | Status: DC | PRN
  Administered 2022-04-04: 17 g via ORAL
  Filled 2022-04-04: qty 1

## 2022-04-04 NOTE — Progress Notes (Signed)
  Progress Note Patient: Gabrielle [name] BTD:[id number] DOB: 09-17-1946 DOA: 04/02/2022  DOS: the patient was seen and examined on 04/04/2022  Brief hospital course: Past medical history of osteoarthritis, HTN, hypothyroidism, HLD presented to the hospital with a syncopal event with a fall. Found to have right mandibular nondisplaced fracture. Currently being admitted for syncope work-up as well as treatment for mandibular fracture. ENT consulted currently scheduled for closed reduction with mandibulomaxillary fusion on 7:30 AM at 7/2.Marland Kitchen  Assessment and Plan: Syncope and collapse Etiology of the syncope is not clear. Patient tells me that she ate some food after which she started having lightheadedness when she tried to sit up. She called her son but before he can come, patient passed out and fell on her right side. Had jaw pain as well as some chest pain. EKG unremarkable for any acute abnormality. Telemetry so far unremarkable. Echocardiogram shows preserved EF.  No wall abnormality.. Orthostatic vital so far negative. No focal deficit on my examination. We will get PT OT evaluation. Unsure whether the food association is significant to cause a degree of syncope like this.  Closed fracture of right side of mandible (HCC) I discussed with Dr. Nena Jordan.  Appreciate their assistance. Patient will be scheduled for surgery tomorrow.  HTN (hypertension) Blood pressure stable. Monitor.  Hypokalemia Hypercalcemia Electrolytes now normal. We will continue to monitor.  Hyperlipidemia Continue statin.  Hypothyroidism Continue Synthroid.  Subjective: Reports some jaw pain.  No nausea no vomiting.  Also reports some chest pain which appears to be musculoskeletal in nature. No shortness of breath.  No fever no chills.  Physical Exam: Vitals:   04/04/22 0445 04/04/22 0756 04/04/22 0758 04/04/22 0800  BP: 123/63 122/66 137/76 (!) 146/78  Pulse: 65 77 (!) 56 (!) 57  Resp: 16     Temp:  98.1 F (36.7 C) 97.8 F (36.6 C) 97.7 F (36.5 C) 98 F (36.7 C)  TempSrc:  Oral Oral Oral  SpO2: 98% 99% 95% 95%  Weight:      Height:       General: Appear in mild distress; no visible Abnormal Neck Mass Or lumps, Conjunctiva normal Cardiovascular: S1 and S2 Present, no Murmur, Respiratory: good respiratory effort, Bilateral Air entry present and CTA, no Crackles, no wheezes Abdomen: Bowel Sound present, Non tender  Extremities: no Pedal edema Neurology: alert and oriented to time, place, and person  Gait not checked due to patient safety concerns   Data Reviewed: I have Reviewed nursing notes, Vitals, and Lab results since pt's last encounter. Pertinent lab results CBC and BMP I have ordered test including CBC and BMP I have ordered imaging studies chest x-ray. I have independently visualized and interpreted EKG which showed EKG: normal EKG, normal sinus rhythm, nonspecific ST and T waves changes. I have discussed pt's care plan and test results with ENT.   Family Communication: Family at bedside  Disposition: Status is: Inpatient Remains inpatient appropriate because: Needing syncope work-up as well as treatment for mandible fracture.  Author: Lynden Oxford, MD 04/04/2022 3:37 PM  Please look on [email or web address] to find out who is on call.

## 2022-04-04 NOTE — Consult Note (Signed)
Reason for Consult:Mandibular fracture  Referring Physician: Dr. [name] is an 76 y.o. female.  HPI: 76yo F presented to [facility] ED s/p syncopal episode, found to have right sided mandibular fracture on imaging. She is currently undergoing cardiac workup to rule out underlying conditions contributing to LOC. She is stable on my exam with moderate pain localized to the right jaw.   Past Medical History:  Diagnosis Date   Hypertension    Thyroid disease     Past Surgical History:  Procedure Laterality Date   ABDOMINAL HYSTERECTOMY     BREAST EXCISIONAL BIOPSY Left    SHOULDER SURGERY      Family History  Problem Relation Age of Onset   Diabetes Father    Cancer Father    Hypertension Father     Social History:  reports that she has never smoked. She has never used smokeless tobacco. She reports that she does not drink alcohol and does not use drugs.  Allergies:  Allergies  Allergen Reactions   Lidocaine Swelling and Other (See Comments)    Twitching, facial swelling, and shaking     Aspirin Other (See Comments)    Increases heart rate    Hylan G-F 20 Swelling and Other (See Comments)    Decreased facial feeling and chills- also   Lisinopril Other (See Comments) and Cough    Dry cough   Losartan Other (See Comments)    Dizziness/headaches   Metoprolol Other (See Comments)    Dizziness   Olmesartan Other (See Comments)    Hair loss   Codeine Hives, Rash and Other (See Comments)    Hallucinations and syncope, also   Latex Rash   Sulfa Antibiotics Hives and Rash    Medications: I have reviewed the patient's current medications.  Results for orders placed or performed during the hospital encounter of 04/02/22 (from the past 48 hour(s))  CBG monitoring, ED     Status: Abnormal   Collection Time: 04/02/22 10:46 PM  Result Value Ref Range   Glucose-Capillary 169 (H) 70 - 99 mg/dL    Comment: Glucose reference range applies only to samples taken  after fasting for at least 8 hours.  Basic metabolic panel     Status: Abnormal   Collection Time: 04/02/22 10:52 PM  Result Value Ref Range   Sodium 137 135 - 145 mmol/L   Potassium 3.1 (L) 3.5 - 5.1 mmol/L   Chloride 102 98 - 111 mmol/L   CO2 27 22 - 32 mmol/L   Glucose, Bld 157 (H) 70 - 99 mg/dL    Comment: Glucose reference range applies only to samples taken after fasting for at least 8 hours.   BUN 13 8 - 23 mg/dL   Creatinine, Ser 7.20 0.44 - 1.00 mg/dL   Calcium 94.7 (H) 8.9 - 10.3 mg/dL   GFR, Estimated >09 >62 mL/min    Comment: (NOTE) Calculated using the CKD-EPI Creatinine Equation (2021)    Anion gap 8 5 - 15    Comment: Performed at [facility], [address], [place], [place] [address]  CBC with Differential     Status: None   Collection Time: 04/02/22 10:53 PM  Result Value Ref Range   WBC 6.0 4.0 - 10.5 K/uL   RBC 4.51 3.87 - 5.11 MIL/uL   Hemoglobin 13.6 12.0 - 15.0 g/dL   HCT 94.7 65.4 - 65.0 %   MCV 90.9 80.0 - 100.0 fL   MCH 30.2 26.0 - 34.0  pg   MCHC 33.2 30.0 - 36.0 g/dL   RDW 43.3 29.5 - 18.8 %   Platelets 190 150 - 400 K/uL   nRBC 0.0 0.0 - 0.2 %   Neutrophils Relative % 49 %   Neutro Abs 2.9 1.7 - 7.7 K/uL   Lymphocytes Relative 42 %   Lymphs Abs 2.6 0.7 - 4.0 K/uL   Monocytes Relative 7 %   Monocytes Absolute 0.4 0.1 - 1.0 K/uL   Eosinophils Relative 2 %   Eosinophils Absolute 0.1 0.0 - 0.5 K/uL   Basophils Relative 0 %   Basophils Absolute 0.0 0.0 - 0.1 K/uL   Immature Granulocytes 0 %   Abs Immature Granulocytes 0.02 0.00 - 0.07 K/uL    Comment: Performed at [facility], [address], [place], [place] [address]  Basic metabolic panel     Status: Abnormal   Collection Time: 04/03/22  8:58 PM  Result Value Ref Range   Sodium 140 135 - 145 mmol/L   Potassium 3.5 3.5 - 5.1 mmol/L   Chloride 109 98 - 111 mmol/L   CO2 25 22 - 32 mmol/L   Glucose, Bld 136 (H) 70 - 99 mg/dL    Comment: Glucose  reference range applies only to samples taken after fasting for at least 8 hours.   BUN 6 (L) 8 - 23 mg/dL   Creatinine, Ser 6.30 0.44 - 1.00 mg/dL   Calcium 16.0 8.9 - 10.9 mg/dL   GFR, Estimated >32 >35 mL/min    Comment: (NOTE) Calculated using the CKD-EPI Creatinine Equation (2021)    Anion gap 6 5 - 15    Comment: Performed at [facility], [address], [place], [place] [address]    ECHOCARDIOGRAM COMPLETE  Result Date: 04/04/2022    ECHOCARDIOGRAM REPORT   Patient Name:   Gabrielle [name] Date of Exam: 04/04/2022 Medical Rec #:  [id number]       Height:       64.5 in Accession #:    [id number]      Weight:       165.0 lb Date of Birth:  October 20, 1945        BSA:          1.812 m Patient Age:    76 years        BP:           146/78 mmHg Patient Gender: F               HR:           50 bpm. Exam Location:  Inpatient Procedure: 2D Echo, Color Doppler and Cardiac Doppler Indications:    R55 Syncope  History:        Patient has no prior history of Echocardiogram examinations.                 Risk Factors:Hypertension and Dyslipidemia.  Sonographer:    Irving Burton Senior RDCS Referring Phys: [id number] CHING T TU IMPRESSIONS  1. Left ventricular ejection fraction, by estimation, is 60 to 65%. The left ventricle has normal function. The left ventricle has no regional wall motion abnormalities. Left ventricular diastolic parameters were normal.  2. Right ventricular systolic function is normal. The right ventricular size is normal. There is normal pulmonary artery systolic pressure. The estimated right ventricular systolic pressure is 22.3 mmHg.  3. The mitral valve is grossly normal. Trivial mitral valve regurgitation.  4. The aortic valve is tricuspid. Aortic valve regurgitation  is not visualized. Aortic valve sclerosis is present, with no evidence of aortic valve stenosis.  5. The inferior vena cava is normal in size with <50% respiratory variability, suggesting right atrial pressure of 8 mmHg. Comparison(s):  No prior Echocardiogram. FINDINGS  Left Ventricle: Left ventricular ejection fraction, by estimation, is 60 to 65%. The left ventricle has normal function. The left ventricle has no regional wall motion abnormalities. The left ventricular internal cavity size was normal in size. There is  no left ventricular hypertrophy. Left ventricular diastolic parameters were normal. Right Ventricle: The right ventricular size is normal. No increase in right ventricular wall thickness. Right ventricular systolic function is normal. There is normal pulmonary artery systolic pressure. The tricuspid regurgitant velocity is 1.89 m/s, and  with an assumed right atrial pressure of 8 mmHg, the estimated right ventricular systolic pressure is 22.3 mmHg. Left Atrium: Left atrial size was normal in size. Right Atrium: Right atrial size was normal in size. Pericardium: There is no evidence of pericardial effusion. Mitral Valve: The mitral valve is grossly normal. Trivial mitral valve regurgitation. Tricuspid Valve: The tricuspid valve is grossly normal. Tricuspid valve regurgitation is mild. Aortic Valve: The aortic valve is tricuspid. There is mild aortic valve annular calcification. Aortic valve regurgitation is not visualized. Aortic valve sclerosis is present, with no evidence of aortic valve stenosis. Pulmonic Valve: The pulmonic valve was grossly normal. Pulmonic valve regurgitation is trivial. Aorta: The aortic root is normal in size and structure. Venous: The inferior vena cava is normal in size with less than 50% respiratory variability, suggesting right atrial pressure of 8 mmHg. IAS/Shunts: No atrial level shunt detected by color flow Doppler.  LEFT VENTRICLE PLAX 2D LVIDd:         3.20 cm   Diastology LVIDs:         2.40 cm   LV e' medial:    7.07 cm/s LV PW:         0.90 cm   LV E/e' medial:  11.9 LV IVS:        0.80 cm   LV e' lateral:   8.92 cm/s LVOT diam:     2.00 cm   LV E/e' lateral: 9.4 LV SV:         88 LV SV Index:    49 LVOT Area:     3.14 cm  RIGHT VENTRICLE RV S prime:     9.46 cm/s TAPSE (M-mode): 2.4 cm LEFT ATRIUM             Index        RIGHT ATRIUM           Index LA diam:        2.70 cm 1.49 cm/m   RA Area:     14.40 cm LA Vol (A2C):   69.4 ml 38.29 ml/m  RA Volume:   34.00 ml  18.76 ml/m LA Vol (A4C):   34.5 ml 19.04 ml/m LA Biplane Vol: 52.5 ml 28.97 ml/m  AORTIC VALVE LVOT Vmax:   101.00 cm/s LVOT Vmean:  77.400 cm/s LVOT VTI:    0.280 m  AORTA Ao Root diam: 2.70 cm Ao Asc diam:  2.90 cm MITRAL VALVE               TRICUSPID VALVE MV Area (PHT): 2.51 cm    TR Peak grad:   14.3 mmHg MV Decel Time: 302 msec    TR Vmax:        189.00 cm/s  MV E velocity: 83.80 cm/s MV A velocity: 90.10 cm/s  SHUNTS MV E/A ratio:  0.93        Systemic VTI:  0.28 m                            Systemic Diam: 2.00 cm Nona Dell MD Electronically signed by Nona Dell MD Signature Date/Time: 04/04/2022/1:59:25 PM    Final    CT Cervical Spine Wo Contrast  Result Date: 04/02/2022 CLINICAL DATA:  Facial trauma, blunt EXAM: CT CERVICAL SPINE WITHOUT CONTRAST TECHNIQUE: Multidetector CT imaging of the cervical spine was performed without intravenous contrast. Multiplanar CT image reconstructions were also generated. RADIATION DOSE REDUCTION: This exam was performed according to the departmental dose-optimization program which includes automated exposure control, adjustment of the mA and/or kV according to patient size and/or use of iterative reconstruction technique. COMPARISON:  Face CT today FINDINGS: Alignment: Normal Skull base and vertebrae: No acute fracture. No primary bone lesion or focal pathologic process. Soft tissues and spinal canal: No prevertebral fluid or swelling. No visible canal hematoma. Disc levels: Degenerative disc disease with disc space narrowing and spurring from C4-5 through C6-7. Diffuse bilateral degenerative facet disease. Upper chest: No acute findings Other: Right mandibular fracture partially  imaged. See face CT report. IMPRESSION: No acute bony abnormality in the cervical spine. Electronically Signed   By: Charlett Nose M.D.   On: 04/02/2022 23:16   CT Maxillofacial Wo Contrast  Result Date: 04/02/2022 CLINICAL DATA:  Right jaw pain.  Facial trauma, blunt EXAM: CT MAXILLOFACIAL WITHOUT CONTRAST TECHNIQUE: Multidetector CT imaging of the maxillofacial structures was performed. Multiplanar CT image reconstructions were also generated. RADIATION DOSE REDUCTION: This exam was performed according to the departmental dose-optimization program which includes automated exposure control, adjustment of the mA and/or kV according to patient size and/or use of iterative reconstruction technique. COMPARISON:  None Available. FINDINGS: Osseous: There is a right mandibular neck fracture with angulation. Nondisplaced fracture at the base of the coronoid process. Dislocation of the mandibular head anteriorly. No additional facial fracture. Orbits: No orbital fracture.  Globes intact. Sinuses: Clear Soft tissues: Negative Limited intracranial: No significant or unexpected finding. IMPRESSION: Right mandible fracture through the mandibular neck with anterior dislocation of the mandibular head relative to the temporomandibular joint. Nondisplaced fracture through the base of the right mandibular coronoid process. Electronically Signed   By: Charlett Nose M.D.   On: 04/02/2022 23:14    Review of Systems Blood pressure (!) 146/78, pulse (!) 57, temperature 98 F (36.7 C), temperature source Oral, resp. rate 16, height 5' 4.5" (1.638 m), weight 74.8 kg, SpO2 95 %. Physical Exam  Maxillofacial exam:  Mild right middle 1/3 edema, tender No stepping of the facial bones EOMI, VA/VF intact Negative Battle's sign Limited ROM of the mandible  Intraorally Exam: Class II malocclusion of the right side with premature contact Edentulous, with maxillary denture stable, mandibular denture with mild rocking No  lacerations noted No other pathology  Assessment/Plan: 76yo F s/p syncopal fall resulting in right condylar neck fracture and medially displaced condyle causing malocclusion requiring operative intervention. Has multiple discussions with the family in patient in regards to options of CRMMF versus conservative treatment with liquid diet and physical therapy. Discussed that given her edentulous status, we would plan on using her dentures as gunning splints, fixating them to the underlying bone with IMF screws and then wiring the dentures together to obtain MMF. This would  likely require fabrication of new prosthesis post-operatively. She is in the process of having new dentures made, so this is of minimal consequence. Plan for 3-4 weeks fo IMF followed by 2-3 weeks of elastic therapy prior to removing the IMF screws from the dentures. All questions answered. Plan for OR 0730 tomorrow. NPO After midnight. Liquid diet x 6 weeks.   Gabrielle ParodyColin P Sabien Umland 04/04/2022, 3:16 PM

## 2022-04-04 NOTE — Hospital Course (Signed)
Past medical history of osteoarthritis, HTN, hypothyroidism, HLD presented to the hospital with a syncopal event with a fall. Found to have right mandibular nondisplaced fracture. Currently being admitted for syncope work-up as well as treatment for mandibular fracture. ENT consulted currently scheduled for closed reduction with mandibulomaxillary fusion on 7:30 AM at 7/2.Marland Kitchen

## 2022-04-05 ENCOUNTER — Inpatient Hospital Stay (HOSPITAL_COMMUNITY): Payer: Medicare PPO | Admitting: Registered Nurse

## 2022-04-05 ENCOUNTER — Inpatient Hospital Stay (HOSPITAL_COMMUNITY): Payer: Medicare PPO

## 2022-04-05 ENCOUNTER — Other Ambulatory Visit: Payer: Self-pay

## 2022-04-05 ENCOUNTER — Encounter (HOSPITAL_COMMUNITY): Payer: Self-pay | Admitting: Internal Medicine

## 2022-04-05 ENCOUNTER — Encounter (HOSPITAL_COMMUNITY)
Admission: EM | Disposition: A | Discharge status: Home / Self Care | Payer: Self-pay | Source: Home / Self Care | Attending: Internal Medicine

## 2022-04-05 DIAGNOSIS — K0820 Unspecified atrophy of edentulous alveolar ridge: Secondary | ICD-10-CM

## 2022-04-05 DIAGNOSIS — S02621A Fracture of subcondylar process of right mandible, initial encounter for closed fracture: Secondary | ICD-10-CM

## 2022-04-05 DIAGNOSIS — R55 Syncope and collapse: Secondary | ICD-10-CM | POA: Diagnosis not present

## 2022-04-05 HISTORY — PX: CLOSED REDUCTION MANDIBLE WITH MANDIBULOMA: SHX5313

## 2022-04-05 LAB — CBC
HCT: 39.6 % (ref 36.0–46.0)
Hemoglobin: 13.4 g/dL (ref 12.0–15.0)
MCH: 31 pg (ref 26.0–34.0)
MCHC: 33.8 g/dL (ref 30.0–36.0)
MCV: 91.7 fL (ref 80.0–100.0)
Platelets: 167 10*3/uL (ref 150–400)
RBC: 4.32 MIL/uL (ref 3.87–5.11)
RDW: 12.1 % (ref 11.5–15.5)
WBC: 6.1 10*3/uL (ref 4.0–10.5)
nRBC: 0 % (ref 0.0–0.2)

## 2022-04-05 LAB — BASIC METABOLIC PANEL
Anion gap: 9 (ref 5–15)
BUN: 7 mg/dL — ABNORMAL LOW (ref 8–23)
CO2: 22 mmol/L (ref 22–32)
Calcium: 10.2 mg/dL (ref 8.9–10.3)
Chloride: 108 mmol/L (ref 98–111)
Creatinine, Ser: 0.6 mg/dL (ref 0.44–1.00)
GFR, Estimated: >60 mL/min (ref >60)
Glucose, Bld: 103 mg/dL — ABNORMAL HIGH (ref 70–99)
Potassium: 3.9 mmol/L (ref 3.5–5.1)
Sodium: 139 mmol/L (ref 135–145)

## 2022-04-05 LAB — MAGNESIUM: Magnesium: 2.4 mg/dL (ref 1.7–2.4)

## 2022-04-05 SURGERY — CLOSED REDUCTION, MANDIBLE, WITH ARCH BAR APPLICATION AND INTERMAXILLARY FIXATION
Anesthesia: General | Site: Mouth

## 2022-04-05 MED ORDER — ACETAMINOPHEN 10 MG/ML IV SOLN
INTRAVENOUS | Status: AC
  Filled 2022-04-05: qty 100

## 2022-04-05 MED ORDER — ACETAMINOPHEN 10 MG/ML IV SOLN
1000.0000 mg | Freq: Once | INTRAVENOUS | Status: DC | PRN
  Administered 2022-04-05: 1000 mg via INTRAVENOUS

## 2022-04-05 MED ORDER — PROPOFOL 10 MG/ML IV BOLUS
INTRAVENOUS | Status: DC | PRN
  Administered 2022-04-05: 160 mg via INTRAVENOUS

## 2022-04-05 MED ORDER — PROPOFOL 10 MG/ML IV BOLUS
INTRAVENOUS | Status: AC
Start: 2022-04-05 — End: ?
  Filled 2022-04-05: qty 20

## 2022-04-05 MED ORDER — LACTATED RINGERS IV SOLN: INTRAVENOUS | Status: DC

## 2022-04-05 MED ORDER — GLYCOPYRROLATE PF 0.2 MG/ML IJ SOSY
PREFILLED_SYRINGE | INTRAMUSCULAR | Status: AC
  Filled 2022-04-05: qty 1

## 2022-04-05 MED ORDER — CHLORHEXIDINE GLUCONATE 0.12 % MT SOLN: 15.0000 mL | Freq: Two times a day (BID) | OROMUCOSAL | 0 refills | Status: DC

## 2022-04-05 MED ORDER — 0.9 % SODIUM CHLORIDE (POUR BTL) OPTIME
TOPICAL | Status: DC | PRN
  Administered 2022-04-05: 1000 mL

## 2022-04-05 MED ORDER — HYDROCODONE-ACETAMINOPHEN 7.5-325 MG/15ML PO SOLN: 15.0000 mL | Freq: Four times a day (QID) | ORAL | 0 refills | Status: AC | PRN

## 2022-04-05 MED ORDER — ARTIFICIAL TEARS OPHTHALMIC OINT
TOPICAL_OINTMENT | OPHTHALMIC | Status: AC
Start: 2022-04-05 — End: ?
  Filled 2022-04-05: qty 3.5

## 2022-04-05 MED ORDER — ENSURE ENLIVE PO LIQD: 237.0000 mL | Freq: Two times a day (BID) | ORAL | 0 refills | Status: DC

## 2022-04-05 MED ORDER — SUGAMMADEX SODIUM 200 MG/2ML IV SOLN
INTRAVENOUS | Status: DC | PRN
  Administered 2022-04-05: 200 mg via INTRAVENOUS

## 2022-04-05 MED ORDER — ONDANSETRON HCL 4 MG/2ML IJ SOLN
INTRAMUSCULAR | Status: AC
  Filled 2022-04-05: qty 2

## 2022-04-05 MED ORDER — GLYCOPYRROLATE PF 0.2 MG/ML IJ SOSY
PREFILLED_SYRINGE | INTRAMUSCULAR | Status: DC | PRN
  Administered 2022-04-05: .2 mg via INTRAVENOUS

## 2022-04-05 MED ORDER — ORAL CARE MOUTH RINSE
15.0000 mL | Freq: Once | OROMUCOSAL | Status: AC
Start: 2022-04-05 — End: 2022-04-05

## 2022-04-05 MED ORDER — FENTANYL CITRATE (PF) 250 MCG/5ML IJ SOLN
INTRAMUSCULAR | Status: DC | PRN
  Administered 2022-04-05: 100 ug via INTRAVENOUS
  Administered 2022-04-05 (×2): 50 ug via INTRAVENOUS

## 2022-04-05 MED ORDER — BUPIVACAINE-EPINEPHRINE 0.5% -1:200000 IJ SOLN
INTRAMUSCULAR | Status: AC
  Filled 2022-04-05: qty 1

## 2022-04-05 MED ORDER — CEFAZOLIN SODIUM-DEXTROSE 2-4 GM/100ML-% IV SOLN
2.0000 g | Freq: Once | INTRAVENOUS | Status: AC
  Administered 2022-04-05: 2 g via INTRAVENOUS

## 2022-04-05 MED ORDER — FENTANYL CITRATE (PF) 250 MCG/5ML IJ SOLN
INTRAMUSCULAR | Status: AC
  Filled 2022-04-05: qty 5

## 2022-04-05 MED ORDER — OXYMETAZOLINE HCL 0.05 % NA SOLN
NASAL | Status: DC | PRN
  Administered 2022-04-05: 2 via NASAL

## 2022-04-05 MED ORDER — CHLORHEXIDINE GLUCONATE 0.12 % MT SOLN
OROMUCOSAL | Status: DC
Start: 2022-04-05 — End: 2022-04-05
  Filled 2022-04-05: qty 30

## 2022-04-05 MED ORDER — FENTANYL CITRATE (PF) 100 MCG/2ML IJ SOLN
25.0000 ug | INTRAMUSCULAR | Status: DC | PRN
  Administered 2022-04-05 (×2): 50 ug via INTRAVENOUS

## 2022-04-05 MED ORDER — POLYETHYLENE GLYCOL 3350 17 G PO PACK: 17.0000 g | PACK | Freq: Every day | ORAL | 0 refills | Status: AC | PRN

## 2022-04-05 MED ORDER — CEFAZOLIN SODIUM-DEXTROSE 2-4 GM/100ML-% IV SOLN: 2.0000 g | INTRAVENOUS | Status: DC

## 2022-04-05 MED ORDER — CHLORHEXIDINE GLUCONATE 0.12 % MT SOLN: 15.0000 mL | Freq: Two times a day (BID) | OROMUCOSAL | 2 refills | Status: DC

## 2022-04-05 MED ORDER — CEFAZOLIN SODIUM-DEXTROSE 2-4 GM/100ML-% IV SOLN
INTRAVENOUS | Status: AC
  Filled 2022-04-05: qty 100

## 2022-04-05 MED ORDER — BUPIVACAINE-EPINEPHRINE (PF) 0.5% -1:200000 IJ SOLN
INTRAMUSCULAR | Status: DC | PRN
  Administered 2022-04-05: 5 mL

## 2022-04-05 MED ORDER — FENTANYL CITRATE (PF) 100 MCG/2ML IJ SOLN
INTRAMUSCULAR | Status: AC
  Administered 2022-04-05: 50 ug
  Filled 2022-04-05: qty 2

## 2022-04-05 MED ORDER — ROCURONIUM BROMIDE 10 MG/ML (PF) SYRINGE
PREFILLED_SYRINGE | INTRAVENOUS | Status: DC | PRN
  Administered 2022-04-05: 50 mg via INTRAVENOUS

## 2022-04-05 MED ORDER — ENSURE ENLIVE PO LIQD
237.0000 mL | Freq: Two times a day (BID) | ORAL | Status: DC
Start: 2022-04-05 — End: 2022-04-05

## 2022-04-05 MED ORDER — DEXAMETHASONE SODIUM PHOSPHATE 10 MG/ML IJ SOLN
INTRAMUSCULAR | Status: AC
Start: 2022-04-05 — End: ?
  Filled 2022-04-05: qty 1

## 2022-04-05 MED ORDER — ROCURONIUM BROMIDE 10 MG/ML (PF) SYRINGE
PREFILLED_SYRINGE | INTRAVENOUS | Status: AC
  Filled 2022-04-05: qty 10

## 2022-04-05 MED ORDER — ACETAMINOPHEN 160 MG/5ML PO LIQD: 500.0000 mg | ORAL | 1 refills | Status: DC | PRN

## 2022-04-05 MED ORDER — ONDANSETRON HCL 4 MG/2ML IJ SOLN
INTRAMUSCULAR | Status: DC | PRN
  Administered 2022-04-05: 4 mg via INTRAVENOUS

## 2022-04-05 MED ORDER — CHLORHEXIDINE GLUCONATE 0.12 % MT SOLN
15.0000 mL | Freq: Once | OROMUCOSAL | Status: AC
  Administered 2022-04-05: 15 mL via OROMUCOSAL
  Filled 2022-04-05: qty 15

## 2022-04-05 MED ORDER — DEXAMETHASONE SODIUM PHOSPHATE 10 MG/ML IJ SOLN
INTRAMUSCULAR | Status: DC | PRN
  Administered 2022-04-05: 5 mg via INTRAVENOUS

## 2022-04-05 SURGICAL SUPPLY — 62 items
BAG COUNTER SPONGE SURGICOUNT (BAG) ×3 IMPLANT
BAG SPNG CNTER NS LX DISP (BAG) ×1
BAR ARCH PREFORM MXLMNDB FX (Miscellaneous) ×2 IMPLANT
BAR FIX PREFORMED OMNIMAX (Miscellaneous) ×2 IMPLANT
BIT DRILL 1.6X50 (BIT) ×1 IMPLANT
BLADE MANDIBULAR SCREWDRIVER (BLADE) ×1 IMPLANT
BLADE RECIPRO TAPERED (BLADE) IMPLANT
BLADE SURG 10 STRL SS (BLADE) IMPLANT
BLADE SURG 15 STRL LF DISP TIS (BLADE) IMPLANT
BLADE SURG 15 STRL SS (BLADE) ×2
BUR RND DIAMOND ELITE 3.5 (BURR) IMPLANT
BUR SURG 4X8 MED (BURR) IMPLANT
BURR SURG 4X8 MED (BURR) ×2
CANISTER SUCT 3000ML PPV (MISCELLANEOUS) ×3 IMPLANT
CLEANER TIP ELECTROSURG 2X2 (MISCELLANEOUS) ×2 IMPLANT
COVER SURGICAL LIGHT HANDLE (MISCELLANEOUS) ×3 IMPLANT
DRAPE HALF SHEET 40X57 (DRAPES) IMPLANT
ELECT COATED BLADE 2.86 ST (ELECTRODE) ×2 IMPLANT
ELECT NDL BLADE 2-5/6 (NEEDLE) IMPLANT
ELECT NEEDLE BLADE 2-5/6 (NEEDLE) IMPLANT
ELECT REM PT RETURN 9FT ADLT (ELECTROSURGICAL) ×2
ELECTRODE REM PT RTRN 9FT ADLT (ELECTROSURGICAL) ×2 IMPLANT
GLOVE BIO SURGEON STRL SZ8 (GLOVE) ×3 IMPLANT
GLOVE BIOGEL PI IND STRL 8 (GLOVE) ×2 IMPLANT
GLOVE BIOGEL PI INDICATOR 8 (GLOVE) ×1
GOWN STRL REUS W/ TWL LRG LVL3 (GOWN DISPOSABLE) ×2 IMPLANT
GOWN STRL REUS W/ TWL XL LVL3 (GOWN DISPOSABLE) ×2 IMPLANT
GOWN STRL REUS W/TWL LRG LVL3 (GOWN DISPOSABLE) ×4
GOWN STRL REUS W/TWL XL LVL3 (GOWN DISPOSABLE) ×2
KIT BASIN OR (CUSTOM PROCEDURE TRAY) ×3 IMPLANT
KIT TURNOVER KIT B (KITS) ×3 IMPLANT
NDL HYPO 25GX1X1/2 BEV (NEEDLE) ×2 IMPLANT
NEEDLE HYPO 25GX1X1/2 BEV (NEEDLE) ×2 IMPLANT
NS IRRIG 1000ML POUR BTL (IV SOLUTION) ×3 IMPLANT
PAD ARMBOARD 7.5X6 YLW CONV (MISCELLANEOUS) ×6 IMPLANT
PATTIES SURGICAL .5 X3 (DISPOSABLE) IMPLANT
PENCIL BUTTON HOLSTER BLD 10FT (ELECTRODE) ×2 IMPLANT
POSITIONER HEAD DONUT 9IN (MISCELLANEOUS) ×3 IMPLANT
PROTECTOR CORNEAL (OPHTHALMIC RELATED) IMPLANT
SCISSORS WIRE ANG 4 3/4 DISP (INSTRUMENTS) ×1 IMPLANT
SCREW BONE 2X7 CROSS DRIVE (Screw) ×8 IMPLANT
SCREW NON LOCK X-DR 2.0X12 (Screw) ×8 IMPLANT
SCREW NON LOCK X-DR 2.3X12 (Screw) ×2 IMPLANT
SPIKE FLUID TRANSFER (MISCELLANEOUS) ×2 IMPLANT
SUT CHROMIC 3 0 PS 2 (SUTURE) IMPLANT
SUT CHROMIC 4 0 PS 2 18 (SUTURE) IMPLANT
SUT ETHILON 5 0 P 3 18 (SUTURE)
SUT MNCRL AB 4-0 PS2 18 (SUTURE) IMPLANT
SUT NYLON ETHILON 5-0 P-3 1X18 (SUTURE) IMPLANT
SUT PROLENE 5 0 PS 2 (SUTURE) ×3 IMPLANT
SUT SILK 2 0 PERMA HAND 18 BK (SUTURE) IMPLANT
SUT STEEL 0 (SUTURE)
SUT STEEL 0 18XMFL TIE 17 (SUTURE) IMPLANT
SUT STEEL 4 (SUTURE) IMPLANT
SUT VIC AB 3-0 SH 27 (SUTURE)
SUT VIC AB 3-0 SH 27X BRD (SUTURE) ×2 IMPLANT
SUT VICRYL 4-0 PS2 18IN ABS (SUTURE) ×2 IMPLANT
TOOTHBRUSH ADULT (PERSONAL CARE ITEMS) ×1 IMPLANT
TOWEL GREEN STERILE FF (TOWEL DISPOSABLE) ×3 IMPLANT
TRAY ENT MC OR (CUSTOM PROCEDURE TRAY) ×3 IMPLANT
TUBE CONNECTING 12X1/4 (SUCTIONS) ×1 IMPLANT
WATER STERILE IRR 1000ML POUR (IV SOLUTION) ×3 IMPLANT

## 2022-04-05 NOTE — OR Nursing (Signed)
EDUCATION COMPLETED WITH FLOOR RN REGARDING ARCH BARS. PT. INSTRUCTED TO HIT CALL BUTTON FOR FIRST SIGNS OF NAUSEA- SCISSORS TAPED @ PT. HEAD. FOR EMERGENCY USE ONLY IF PT. BEGINS TO HURL W/ EMESIS--NOT FOR NAUSEA ALONE.Marland Kitchen RELAYED 5 WIRES PLACE LATERALLY TO BE CUT IF EMERGENCE ARRISES--EMESIS TO PREVENT ASPIRATION OF EMESIS.

## 2022-04-05 NOTE — Progress Notes (Signed)
Discharge instructions given to the patient and patient's son Mellody Dance.  Instructions emphasized on limitations and diet relating to right mandible surgery as advised by HEENT/Sherwood MD.  Reiterate instructions from Dr. Allena Katz regarding heart monitoring, device for pick up from the cardiology clinic.  Questions from both patient and son have been addressed.  Encouraged to call the doctor for concerns.  Patient and son verbalized understanding of instructions.  Discharged home.

## 2022-04-05 NOTE — Progress Notes (Signed)
Day of Surgery   Subjective/Chief Complaint: Pt with osteoarthritis, HTN, hypothyroidism, HLD presented to the hospital with a syncopal event with a fall resulting in significantly displaced fracture of the right mandibular condyle. Plan for closed reduction with fixation of dentures and intermaxillary fixation today. No acute events overnight.    Objective: Vital signs in last 24 hours: Temp:  [97.7 F (36.5 C)-98.2 F (36.8 C)] 97.7 F (36.5 C) (07/02 0704) Pulse Rate:  [53-77] 58 (07/02 0704) Resp:  [16-18] 16 (07/02 0704) BP: (122-150)/(58-78) 145/73 (07/02 0704) SpO2:  [94 %-99 %] 96 % (07/02 0704) Weight:  [74.8 kg] 74.8 kg (07/02 0704) Last BM Date : 04/02/22  Intake/Output from previous day: 07/01 0701 - 07/02 0700 In: 600 [P.O.:600] Out: -  Intake/Output this shift: No intake/output data recorded.  Maxillofacial exam:  Mild right middle 1/3 edema, tender No stepping of the facial bones EOMI, VA/VF intact Negative Battle's sign Limited ROM of the mandible   Intraorally Exam: Class II malocclusion of the right side with premature contact Edentulous, with maxillary denture stable, mandibular denture with mild rocking No lacerations noted No other pathology  Lab Results:  Recent Labs    04/02/22 2253 04/05/22 0449  WBC 6.0 6.1  HGB 13.6 13.4  HCT 41.0 39.6  PLT 190 167   BMET Recent Labs    04/03/22 2058 04/05/22 0449  NA 140 139  K 3.5 3.9  CL 109 108  CO2 25 22  GLUCOSE 136* 103*  BUN 6* 7*  CREATININE 0.66 0.60  CALCIUM 10.2 10.2   PT/INR No results for input(s): "LABPROT", "INR" in the last 72 hours. ABG No results for input(s): "PHART", "HCO3" in the last 72 hours.  Invalid input(s): "PCO2", "PO2"  Studies/Results: DG Chest 2 View  Result Date: 04/04/2022 CLINICAL DATA:  [id number] chest discomfort EXAM: CHEST - 2 VIEW COMPARISON:  Mar 02, 2018 FINDINGS: The cardiomediastinal silhouette is unchanged in contour. No pleural effusion. No  pneumothorax. Scattered thin linear opacities most consistent with atelectasis. Visualized abdomen is unremarkable. Minimal degenerative changes of the thoracic spine. IMPRESSION: Scattered bilateral atelectasis. Electronically Signed   By: Meda Klinefelter M.D.   On: 04/04/2022 17:35   ECHOCARDIOGRAM COMPLETE  Result Date: 04/04/2022    ECHOCARDIOGRAM REPORT   Patient Name:   Gabrielle [name] Date of Exam: 04/04/2022 Medical Rec #:  [id number]       Height:       64.5 in Accession #:    [id number]      Weight:       165.0 lb Date of Birth:  02/27/1946        BSA:          1.812 m Patient Age:    76 years        BP:           146/78 mmHg Patient Gender: F               HR:           50 bpm. Exam Location:  Inpatient Procedure: 2D Echo, Color Doppler and Cardiac Doppler Indications:    R55 Syncope  History:        Patient has no prior history of Echocardiogram examinations.                 Risk Factors:Hypertension and Dyslipidemia.  Sonographer:    Irving Burton Senior RDCS Referring Phys: [id number] CHING T TU IMPRESSIONS  1. Left ventricular ejection fraction, by  estimation, is 60 to 65%. The left ventricle has normal function. The left ventricle has no regional wall motion abnormalities. Left ventricular diastolic parameters were normal.  2. Right ventricular systolic function is normal. The right ventricular size is normal. There is normal pulmonary artery systolic pressure. The estimated right ventricular systolic pressure is 22.3 mmHg.  3. The mitral valve is grossly normal. Trivial mitral valve regurgitation.  4. The aortic valve is tricuspid. Aortic valve regurgitation is not visualized. Aortic valve sclerosis is present, with no evidence of aortic valve stenosis.  5. The inferior vena cava is normal in size with <50% respiratory variability, suggesting right atrial pressure of 8 mmHg. Comparison(s): No prior Echocardiogram. FINDINGS  Left Ventricle: Left ventricular ejection fraction, by estimation, is 60 to 65%. The  left ventricle has normal function. The left ventricle has no regional wall motion abnormalities. The left ventricular internal cavity size was normal in size. There is  no left ventricular hypertrophy. Left ventricular diastolic parameters were normal. Right Ventricle: The right ventricular size is normal. No increase in right ventricular wall thickness. Right ventricular systolic function is normal. There is normal pulmonary artery systolic pressure. The tricuspid regurgitant velocity is 1.89 m/s, and  with an assumed right atrial pressure of 8 mmHg, the estimated right ventricular systolic pressure is 22.3 mmHg. Left Atrium: Left atrial size was normal in size. Right Atrium: Right atrial size was normal in size. Pericardium: There is no evidence of pericardial effusion. Mitral Valve: The mitral valve is grossly normal. Trivial mitral valve regurgitation. Tricuspid Valve: The tricuspid valve is grossly normal. Tricuspid valve regurgitation is mild. Aortic Valve: The aortic valve is tricuspid. There is mild aortic valve annular calcification. Aortic valve regurgitation is not visualized. Aortic valve sclerosis is present, with no evidence of aortic valve stenosis. Pulmonic Valve: The pulmonic valve was grossly normal. Pulmonic valve regurgitation is trivial. Aorta: The aortic root is normal in size and structure. Venous: The inferior vena cava is normal in size with less than 50% respiratory variability, suggesting right atrial pressure of 8 mmHg. IAS/Shunts: No atrial level shunt detected by color flow Doppler.  LEFT VENTRICLE PLAX 2D LVIDd:         3.20 cm   Diastology LVIDs:         2.40 cm   LV e' medial:    7.07 cm/s LV PW:         0.90 cm   LV E/e' medial:  11.9 LV IVS:        0.80 cm   LV e' lateral:   8.92 cm/s LVOT diam:     2.00 cm   LV E/e' lateral: 9.4 LV SV:         88 LV SV Index:   49 LVOT Area:     3.14 cm  RIGHT VENTRICLE RV S prime:     9.46 cm/s TAPSE (M-mode): 2.4 cm LEFT ATRIUM              Index        RIGHT ATRIUM           Index LA diam:        2.70 cm 1.49 cm/m   RA Area:     14.40 cm LA Vol (A2C):   69.4 ml 38.29 ml/m  RA Volume:   34.00 ml  18.76 ml/m LA Vol (A4C):   34.5 ml 19.04 ml/m LA Biplane Vol: 52.5 ml 28.97 ml/m  AORTIC VALVE LVOT Vmax:   101.00  cm/s LVOT Vmean:  77.400 cm/s LVOT VTI:    0.280 m  AORTA Ao Root diam: 2.70 cm Ao Asc diam:  2.90 cm MITRAL VALVE               TRICUSPID VALVE MV Area (PHT): 2.51 cm    TR Peak grad:   14.3 mmHg MV Decel Time: 302 msec    TR Vmax:        189.00 cm/s MV E velocity: 83.80 cm/s MV A velocity: 90.10 cm/s  SHUNTS MV E/A ratio:  0.93        Systemic VTI:  0.28 m                            Systemic Diam: 2.00 cm Nona Dell MD Electronically signed by Nona Dell MD Signature Date/Time: 04/04/2022/1:59:25 PM    Final     Anti-infectives: Anti-infectives (From admission, onward)    Start     Dose/Rate Route Frequency Ordered Stop   04/05/22 0702  ceFAZolin (ANCEF) 2-4 GM/100ML-% IVPB       Note to Pharmacy: Crissie Sickles: cabinet override      04/05/22 0702 04/05/22 1914       Assessment/Plan: s/p Procedure(s): CLOSED REDUCTION MANDIBLE WITH MANDIBULOMAXILLARY FUSION (N/A) 76yo F s/p syncopal fall resulting in right condylar neck fracture and medially displaced condyle causing malocclusion requiring operative intervention. No changes since discussion with patient an d family yesterday. Will continue with plan for fixation of dentures and intermaxillary fixation for 3 weeks prior to release and rehab with elastics. Liquid diet x 6 weeks.   LOS: 1 day    Exie Parody 04/05/2022

## 2022-04-05 NOTE — Anesthesia Procedure Notes (Signed)
Procedure Name: Intubation Date/Time: 04/05/2022 7:43 AM  Performed by: Mayer Camel, CRNAPre-anesthesia Checklist: Patient identified, Emergency Drugs available, Suction available and Patient being monitored Patient Re-evaluated:Patient Re-evaluated prior to induction Oxygen Delivery Method: Circle System Utilized Preoxygenation: Pre-oxygenation with 100% oxygen Induction Type: IV induction Ventilation: Mask ventilation without difficulty Laryngoscope Size: Miller and 2 Grade View: Grade I Nasal Tubes: Nasal Rae, Nasal prep performed and Magill forceps - small, utilized Tube size: 7.0 mm Number of attempts: 1 Placement Confirmation: ETT inserted through vocal cords under direct vision, positive ETCO2 and breath sounds checked- equal and bilateral Secured at: 24 cm Tube secured with: Tape Dental Injury: Teeth and Oropharynx as per pre-operative assessment

## 2022-04-05 NOTE — Anesthesia Preprocedure Evaluation (Signed)
Anesthesia Evaluation  Patient identified by MRN, date of birth, ID band Patient awake    Reviewed: Allergy & Precautions, NPO status , Patient's Chart, lab work & pertinent test results  Airway Mallampati: IV  TM Distance: >3 FB Neck ROM: Full  Mouth opening: Limited Mouth Opening  Dental  (+) Upper Dentures, Lower Dentures   Pulmonary neg pulmonary ROS,    Pulmonary exam normal        Cardiovascular hypertension, Pt. on medications  Rhythm:Regular Rate:Normal     Neuro/Psych negative neurological ROS  negative psych ROS   GI/Hepatic negative GI ROS, Neg liver ROS,   Endo/Other  Hypothyroidism   Renal/GU negative Renal ROS  negative genitourinary   Musculoskeletal Ankle fx   Abdominal Normal abdominal exam  (+)   Peds  Hematology negative hematology ROS (+)   Anesthesia Other Findings   Reproductive/Obstetrics                             Anesthesia Physical Anesthesia Plan  ASA: 2  Anesthesia Plan: General   Post-op Pain Management:    Induction: Intravenous  PONV Risk Score and Plan: 3 and Ondansetron, Dexamethasone and Treatment may vary due to age or medical condition  Airway Management Planned: Mask and Nasal ETT  Additional Equipment: None  Intra-op Plan:   Post-operative Plan: Extubation in OR  Informed Consent: I have reviewed the patients History and Physical, chart, labs and discussed the procedure including the risks, benefits and alternatives for the proposed anesthesia with the patient or authorized representative who has indicated his/her understanding and acceptance.     Dental advisory given  Plan Discussed with: CRNA  Anesthesia Plan Comments:         Anesthesia Quick Evaluation

## 2022-04-05 NOTE — Anesthesia Postprocedure Evaluation (Signed)
Anesthesia Post Note  Patient: Mariane Duval  Procedure(s) Performed: CLOSED REDUCTION MANDIBLE WITH MANDIBULOMAXILLARY FUSION (Mouth)     Patient location during evaluation: PACU Anesthesia Type: General Level of consciousness: awake and alert Pain management: pain level controlled Vital Signs Assessment: post-procedure vital signs reviewed and stable Respiratory status: spontaneous breathing, nonlabored ventilation, respiratory function stable and patient connected to nasal cannula oxygen Cardiovascular status: blood pressure returned to baseline and stable Postop Assessment: no apparent nausea or vomiting Anesthetic complications: no   No notable events documented.  Last Vitals:  Vitals:   04/05/22 0933 04/05/22 1216  BP: (!) 169/90 136/84  Pulse: (!) 59 (!) 54  Resp: 16 16  Temp: 36.8 C 36.7 C  SpO2: 97% 96%    Last Pain:  Vitals:   04/05/22 1216  TempSrc: Oral  PainSc:                  Nelle Don Josselyn Harkins

## 2022-04-05 NOTE — Discharge Instructions (Signed)
Stay on a liquid diet for the next 3 weeks.  Keep wire cutters with you at all times, cut the wires in case of respiratory distress. Avoid contact to the face. Ice to the right jaw for the next 24 hours. Sleep with head of bed elevated for the next 72 hours.  Follow up with Dr. Ross Marcus in 3 weeks or before if you have any issues.

## 2022-04-05 NOTE — Transfer of Care (Signed)
Immediate Anesthesia Transfer of Care Note  Patient: Gabrielle [name]  Procedure(s) Performed: CLOSED REDUCTION MANDIBLE WITH MANDIBULOMAXILLARY FUSION (Mouth)  Patient Location: PACU  Anesthesia Type:General  Level of Consciousness: awake, alert  and oriented  Airway & Oxygen Therapy: Patient Spontanous Breathing and Patient connected to face mask oxygen  Post-op Assessment: Report given to RN and Post -op Vital signs reviewed and stable  Post vital signs: Reviewed and stable  Last Vitals:  Vitals Value Taken Time  BP    Temp    Pulse    Resp    SpO2      Last Pain:  Vitals:   04/05/22 0704  TempSrc: Oral  PainSc:       Patients Stated Pain Goal: 2 (04/04/22 1145)  Complications: No notable events documented.

## 2022-04-05 NOTE — Op Note (Signed)
OPERATIVE REPORT  Patient: Gabrielle [name] DOB: [birth year] MRN: [id number] Surgeon: Dr. Tennis Ship Assistant: none EBL: minimal Complications: none  Pre-operative diagnosis: 1) Right condylar neck fracture  Post-operative Diagnosis: 1) Right subcondylar fracture 2) Edentulous state  Procedure:  1) Closed reduction, intermaxillary fixation right condyle fracture  Indications for procedure: 76yo F s/p syncopal fall resulting in right condylar neck fracture and medially displaced condyle causing malocclusion requiring operative intervention. Had multiple discussions with the family in patient in regards to options of CRMMF versus conservative treatment with liquid diet and physical therapy. Discussed that given her edentulous status, we would plan on using her dentures as gunning splints, fixating them to the underlying bone with IMF screws and then wiring the dentures together to obtain MMF.   Description of procedure: The patient was brought to the operating room and placed supine on the operating room table. After successful induction via nasotracheal intubation, the patient was prepped and draped for this oral surgical procedure. 5cc 0.5% Marcaine was injected locally in the maxilla and  mandible. A moistened throat pack was placed. The patient's dentures were then placed in the mouth and we began with fixating the mandibular denture. This was accomplished with (4) 2.0 x 3mm fracture screws with care taken to avoid the dental implants. The anterior screws were difficult to engage alveolar bone due to the degree of resorption. We then turned our attention to the maxilla, which was much more stable and again fixated with (4) 2.0 x 52mm fracture screws. The dentures were found to be well fixated with no mobility. Hybrid arch bars were then applied to the dentures. The throat pack was removed and the patient was placed into intermaxillary fixation using 25 gauge wire. Bilateral intercuspation was  noted on the dentures, appearing to be premorbid.   The care of the patient was then turned back over to the anesthesia team who successfully extubated the patient and transported her to the PACU in stable condition. The patient was accompanied by a pair of wire cutters and hand off was intentional with instructions to keep wire cutters with the patient at all times and cut wires in case of respiratory distress.

## 2022-04-05 NOTE — Progress Notes (Signed)
Received pt back from PACU.  Pt is awake, oriented x 4.  Teeth wired shut, dental scissors from PACU at bedside.  Patient and son aware of plan of care as discussed by [facility] surgeon prior to surgery, knowledge reinforced, both verbalized understanding.  Assisted in position of comfort, needs addressed.

## 2022-04-06 ENCOUNTER — Ambulatory Visit (INDEPENDENT_AMBULATORY_CARE_PROVIDER_SITE_OTHER): Payer: Medicare PPO

## 2022-04-06 ENCOUNTER — Encounter (HOSPITAL_COMMUNITY): Payer: Self-pay | Admitting: Oral Surgery

## 2022-04-06 DIAGNOSIS — R55 Syncope and collapse: Secondary | ICD-10-CM

## 2022-04-06 NOTE — Progress Notes (Unsigned)
Enrolled for [employer] to mail a ZIO XT long term holter monitor to the patients address on file.   DOD to read.  Results to Dr. Elias Else.

## 2022-04-07 NOTE — Discharge Summary (Signed)
Physician Discharge Summary   Patient: Gabrielle [name] MRN: [id number] DOB: 01/10/1946  Admit date:     04/02/2022  Discharge date: 04/05/2022  Discharge Physician: Lynden OxfordPranav Avon Mergenthaler  PCP: Elias Elseeade, Robert, MD  Recommendations at discharge: Follow-up with PCP in 1 week. Zio patch recommended. Follow-up with general surgery as recommended. Full liquid diet only.  Discharge Diagnoses: Principal Problem:   Syncope and collapse Active Problems:   Closed fracture of right side of mandible (HCC)   HTN (hypertension)   Hypokalemia   Hypercalcemia   Hyperlipidemia   Hypothyroidism   Hospital Course: Past medical history of osteoarthritis, HTN, hypothyroidism, HLD presented to the hospital with a syncopal event with a fall. Found to have right mandibular nondisplaced fracture. Currently being admitted for syncope work-up as well as treatment for mandibular fracture. ENT consulted underwent closed reduction with mandibulomaxillary fusion  Assessment and Plan: Syncope and collapse Etiology of the syncope is not clear. Patient tells me that she ate some food after which she started having lightheadedness when she tried to sit up. She called her son but before he can come, patient passed out and fell on her right side. Had jaw pain as well as some chest pain. EKG unremarkable for any acute abnormality. Telemetry so far unremarkable. Echocardiogram shows preserved EF.  No wall abnormality.. Orthostatic vital so far negative. No focal deficit on my examination. PT recommends no therapy. Unsure whether the food association is significant to cause a degree of syncope like this. Outpatient follow-up with PCP recommended.   Closed fracture of right side of mandible (HCC) Appreciate Dr. Ross MarcusSherwood assistance. Underwent ORIF. Outpatient follow-up recommended. Full liquid diet for now.  HTN (hypertension) Blood pressure stable. Monitor.   Hypokalemia Hypercalcemia Electrolytes now normal. We  will continue to monitor.   Hyperlipidemia Continue statin.   Hypothyroidism Continue Synthroid.      Pain control - [employer] [place] Controlled Substance Reporting System database was reviewed. and patient was instructed, not to drive, operate heavy machinery, perform activities at heights, swimming or participation in water activities or provide baby-sitting services while on Pain, Sleep and Anxiety Medications; until their outpatient Physician has advised to do so again. Also recommended to not to take more than prescribed Pain, Sleep and Anxiety Medications.   Consultants: Oral surgery Procedures performed:  Closed reduction internal maxillary fixation right condyle fracture  DISCHARGE MEDICATION: Allergies as of 04/05/2022       Reactions   Lidocaine Swelling, Other (See Comments)   Twitching, facial swelling, and shaking    Aspirin Other (See Comments)   Increases heart rate   Hylan G-f 20 Swelling, Other (See Comments)   Decreased facial feeling and chills- also   Lisinopril Other (See Comments), Cough   Dry cough   Losartan Other (See Comments)   Dizziness/headaches   Metoprolol Other (See Comments)   Dizziness   Olmesartan Other (See Comments)   Hair loss   Codeine Hives, Rash, Other (See Comments)   Hallucinations and syncope, also   Latex Rash   Sulfa Antibiotics Hives, Rash        Medication List     STOP taking these medications    CO Q 10 PO   COD LIVER PO   hydrochlorothiazide 25 MG tablet Commonly known as: HYDRODIURIL   potassium chloride 10 MEQ tablet Commonly known as: KLOR-CON M   VITAMIN D3 PO       TAKE these medications    acetaminophen 160 MG/5ML liquid Commonly known as: TYLENOL Take  15.6 mLs (500 mg total) by mouth every 4 (four) hours as needed for fever (mild pain).   amLODipine 2.5 MG tablet Commonly known as: NORVASC Take 2.5 mg by mouth at bedtime.   chlorhexidine 0.12 % solution Commonly known as: PERIDEX Use as  directed 15 mLs in the mouth or throat 2 (two) times daily.   COLLAGEN PO Take 1 capsule by mouth 3 (three) times a week.   etodolac 400 MG tablet Commonly known as: LODINE Take 200 mg by mouth at bedtime.   ezetimibe 10 MG tablet Commonly known as: ZETIA Take 10 mg by mouth daily.   feeding supplement Liqd Take 237 mLs by mouth 2 (two) times daily between meals.   Geritol Complete Tabs Take 1 tablet by mouth daily with breakfast.   HYDROcodone-acetaminophen 7.5-325 mg/15 ml solution Commonly known as: HYCET Take 15 mLs by mouth 4 (four) times daily as needed for up to 7 days for moderate pain or severe pain.   Ibuprofen 200 MG Caps Take 200 mg by mouth every 6 (six) hours as needed (for pain, if not taking Etodolac).   levothyroxine 50 MCG tablet Commonly known as: SYNTHROID Take 50 mcg by mouth daily before breakfast.   NON FORMULARY Take 1 tablet by mouth See admin instructions. Magnesium/Zinc/Vitamin C tablets- Take 1 tablet by mouth once a day   polyethylene glycol 17 g packet Commonly known as: MIRALAX / GLYCOLAX Take 17 g by mouth daily as needed for mild constipation.   prednisoLONE acetate 1 % ophthalmic suspension Commonly known as: PRED FORTE Place 1 drop into the right eye See admin instructions. Place 1 drop into the right eye four times daily for 1 week, then three times daily for 1 week, then two times daily for 1 week, then one time daily for 1 week, then stop   rosuvastatin 5 MG tablet Commonly known as: CRESTOR Take 5 mg by mouth every Saturday.   timolol 0.5 % ophthalmic solution Commonly known as: TIMOPTIC Place 1 drop into both eyes daily.   VITAMIN B12 PO Take 1 tablet by mouth daily.   Voltaren 1 % Gel Generic drug: diclofenac Sodium Apply 2 g topically 4 (four) times daily as needed (for pain- shoulders, hands, and knees).   ZyrTEC Allergy 10 MG tablet Generic drug: cetirizine Take 10 mg by mouth daily.                Discharge Care Instructions  (From admission, onward)           Start     Ordered   04/05/22 0000  Leave dressing on - Keep it clean, dry, and intact until clinic visit        04/05/22 1354            Follow-up Information     Tennis Ship P, DMD Follow up in 3 week(s).   Specialty: Oral Surgery Why: Please call for appointment. If there are any concerns prior to your appointment, feel free to call adn come in prior to that. Contact information: [address] [place] [address] [phone]         Elias Else, MD. Schedule an appointment as soon as possible for a visit in 1 week(s).   Specialty: Family Medicine Why: need a referral to cardiology. Contact information: [address] [place] [address] [phone]         [facility] [address] Office Follow up.   Specialty:  Cardiology Why: office will call you for heart monitor. Contact information: [address] [place] [address] [phone]               Disposition: Home Diet recommendation: Full liquid diet  Discharge Exam: Vitals:   04/05/22 0855 04/05/22 0905 04/05/22 0933 04/05/22 1216  BP: (!) 186/97 (!) 186/97 (!) 169/90 136/84  Pulse: 82 78 (!) 59 (!) 54  Resp:  18 16 16   Temp: 98.6 F (37 C) 98.6 F (37 C) 98.2 F (36.8 C) 98 F (36.7 C)  TempSrc:   Oral Oral  SpO2: 100% 100% 97% 96%  Weight:      Height:       General: Appear in mild distress; no visible Abnormal Neck Mass Or lumps, Conjunctiva normal Cardiovascular: S1 and S2 Present, no Murmur, Respiratory: good respiratory effort, Bilateral Air entry present and CTA, no Crackles, no wheezes Abdomen: Bowel Sound present, Non tender  Extremities: no Pedal edema Neurology: alert and oriented to time, place, and person  Gait not checked due to patient safety concerns Filed Weights   04/02/22 2236 04/05/22 0704  Weight: 74.8 kg 74.8 kg   Condition  at discharge: stable  The results of significant diagnostics from this hospitalization (including imaging, microbiology, ancillary and laboratory) are listed below for reference.   Imaging Studies: DG Orthopantogram  Result Date: 04/05/2022 CLINICAL DATA:  Post mandible surgery EXAM: ORTHOPANTOGRAM/PANORAMIC COMPARISON:  Maxillofacial CT 04/02/2022 FINDINGS: Multiple screws are now present at the maxilla and mandible with bridging hardware. Minimally displaced fracture at base of RIGHT mandibular condyle, alignment improved since previous exam. Patient edentulous. No additional fracture identified. IMPRESSION: Post mandibular surgery with improved alignment of displaced RIGHT mandibular condylar fracture since prior study Electronically Signed   By: 04/04/2022 M.D.   On: 04/05/2022 15:09   DG Chest 2 View  Result Date: 04/04/2022 CLINICAL DATA:  06/05/2022 chest discomfort EXAM: CHEST - 2 VIEW COMPARISON:  Mar 02, 2018 FINDINGS: The cardiomediastinal silhouette is unchanged in contour. No pleural effusion. No pneumothorax. Scattered thin linear opacities most consistent with atelectasis. Visualized abdomen is unremarkable. Minimal degenerative changes of the thoracic spine. IMPRESSION: Scattered bilateral atelectasis. Electronically Signed   By: Mar 04, 2018 M.D.   On: 04/04/2022 17:35   ECHOCARDIOGRAM COMPLETE  Result Date: 04/04/2022    ECHOCARDIOGRAM REPORT   Patient Name:   LANE KJOS Date of Exam: 04/04/2022 Medical Rec #:  06/05/2022       Height:       64.5 in Accession #:    [id number]      Weight:       165.0 lb Date of Birth:  Oct 15, 1945        BSA:          1.812 m Patient Age:    76 years        BP:           146/78 mmHg Patient Gender: F               HR:           50 bpm. Exam Location:  Inpatient Procedure: 2D Echo, Color Doppler and Cardiac Doppler Indications:    R55 Syncope  History:        Patient has no prior history of Echocardiogram examinations.                 Risk  Factors:Hypertension and Dyslipidemia.  Sonographer:  [facility] Senior RDCS Referring Phys: [id number] CHING T TU IMPRESSIONS  1. Left ventricular ejection fraction, by estimation, is 60 to 65%. The left ventricle has normal function. The left ventricle has no regional wall motion abnormalities. Left ventricular diastolic parameters were normal.  2. Right ventricular systolic function is normal. The right ventricular size is normal. There is normal pulmonary artery systolic pressure. The estimated right ventricular systolic pressure is 22.3 mmHg.  3. The mitral valve is grossly normal. Trivial mitral valve regurgitation.  4. The aortic valve is tricuspid. Aortic valve regurgitation is not visualized. Aortic valve sclerosis is present, with no evidence of aortic valve stenosis.  5. The inferior vena cava is normal in size with <50% respiratory variability, suggesting right atrial pressure of 8 mmHg. Comparison(s): No prior Echocardiogram. FINDINGS  Left Ventricle: Left ventricular ejection fraction, by estimation, is 60 to 65%. The left ventricle has normal function. The left ventricle has no regional wall motion abnormalities. The left ventricular internal cavity size was normal in size. There is  no left ventricular hypertrophy. Left ventricular diastolic parameters were normal. Right Ventricle: The right ventricular size is normal. No increase in right ventricular wall thickness. Right ventricular systolic function is normal. There is normal pulmonary artery systolic pressure. The tricuspid regurgitant velocity is 1.89 m/s, and  with an assumed right atrial pressure of 8 mmHg, the estimated right ventricular systolic pressure is 22.3 mmHg. Left Atrium: Left atrial size was normal in size. Right Atrium: Right atrial size was normal in size. Pericardium: There is no evidence of pericardial effusion. Mitral Valve: The mitral valve is grossly normal. Trivial mitral valve regurgitation. Tricuspid Valve: The tricuspid valve  is grossly normal. Tricuspid valve regurgitation is mild. Aortic Valve: The aortic valve is tricuspid. There is mild aortic valve annular calcification. Aortic valve regurgitation is not visualized. Aortic valve sclerosis is present, with no evidence of aortic valve stenosis. Pulmonic Valve: The pulmonic valve was grossly normal. Pulmonic valve regurgitation is trivial. Aorta: The aortic root is normal in size and structure. Venous: The inferior vena cava is normal in size with less than 50% respiratory variability, suggesting right atrial pressure of 8 mmHg. IAS/Shunts: No atrial level shunt detected by color flow Doppler.  LEFT VENTRICLE PLAX 2D LVIDd:         3.20 cm   Diastology LVIDs:         2.40 cm   LV e' medial:    7.07 cm/s LV PW:         0.90 cm   LV E/e' medial:  11.9 LV IVS:        0.80 cm   LV e' lateral:   8.92 cm/s LVOT diam:     2.00 cm   LV E/e' lateral: 9.4 LV SV:         88 LV SV Index:   49 LVOT Area:     3.14 cm  RIGHT VENTRICLE RV S prime:     9.46 cm/s TAPSE (M-mode): 2.4 cm LEFT ATRIUM             Index        RIGHT ATRIUM           Index LA diam:        2.70 cm 1.49 cm/m   RA Area:     14.40 cm LA Vol (A2C):   69.4 ml 38.29 ml/m  RA Volume:   34.00 ml  18.76 ml/m LA Vol (A4C):   34.5 ml 19.04  ml/m LA Biplane Vol: 52.5 ml 28.97 ml/m  AORTIC VALVE LVOT Vmax:   101.00 cm/s LVOT Vmean:  77.400 cm/s LVOT VTI:    0.280 m  AORTA Ao Root diam: 2.70 cm Ao Asc diam:  2.90 cm MITRAL VALVE               TRICUSPID VALVE MV Area (PHT): 2.51 cm    TR Peak grad:   14.3 mmHg MV Decel Time: 302 msec    TR Vmax:        189.00 cm/s MV E velocity: 83.80 cm/s MV A velocity: 90.10 cm/s  SHUNTS MV E/A ratio:  0.93        Systemic VTI:  0.28 m                            Systemic Diam: 2.00 cm Nona Dell MD Electronically signed by Nona Dell MD Signature Date/Time: 04/04/2022/1:59:25 PM    Final    CT Cervical Spine Wo Contrast  Result Date: 04/02/2022 CLINICAL DATA:  Facial trauma, blunt EXAM:  CT CERVICAL SPINE WITHOUT CONTRAST TECHNIQUE: Multidetector CT imaging of the cervical spine was performed without intravenous contrast. Multiplanar CT image reconstructions were also generated. RADIATION DOSE REDUCTION: This exam was performed according to the departmental dose-optimization program which includes automated exposure control, adjustment of the mA and/or kV according to patient size and/or use of iterative reconstruction technique. COMPARISON:  Face CT today FINDINGS: Alignment: Normal Skull base and vertebrae: No acute fracture. No primary bone lesion or focal pathologic process. Soft tissues and spinal canal: No prevertebral fluid or swelling. No visible canal hematoma. Disc levels: Degenerative disc disease with disc space narrowing and spurring from C4-5 through C6-7. Diffuse bilateral degenerative facet disease. Upper chest: No acute findings Other: Right mandibular fracture partially imaged. See face CT report. IMPRESSION: No acute bony abnormality in the cervical spine. Electronically Signed   By: Charlett Nose M.D.   On: 04/02/2022 23:16   CT Maxillofacial Wo Contrast  Result Date: 04/02/2022 CLINICAL DATA:  Right jaw pain.  Facial trauma, blunt EXAM: CT MAXILLOFACIAL WITHOUT CONTRAST TECHNIQUE: Multidetector CT imaging of the maxillofacial structures was performed. Multiplanar CT image reconstructions were also generated. RADIATION DOSE REDUCTION: This exam was performed according to the departmental dose-optimization program which includes automated exposure control, adjustment of the mA and/or kV according to patient size and/or use of iterative reconstruction technique. COMPARISON:  None Available. FINDINGS: Osseous: There is a right mandibular neck fracture with angulation. Nondisplaced fracture at the base of the coronoid process. Dislocation of the mandibular head anteriorly. No additional facial fracture. Orbits: No orbital fracture.  Globes intact. Sinuses: Clear Soft tissues:  Negative Limited intracranial: No significant or unexpected finding. IMPRESSION: Right mandible fracture through the mandibular neck with anterior dislocation of the mandibular head relative to the temporomandibular joint. Nondisplaced fracture through the base of the right mandibular coronoid process. Electronically Signed   By: Charlett Nose M.D.   On: 04/02/2022 23:14    Microbiology: Results for orders placed or performed during the hospital encounter of 04/02/22  Surgical pcr screen     Status: None   Collection Time: 04/04/22  8:30 PM   Specimen: Nasal Mucosa; Nasal Swab  Result Value Ref Range Status   MRSA, PCR NEGATIVE NEGATIVE Final   Staphylococcus aureus NEGATIVE NEGATIVE Final    Comment: (NOTE) The Xpert SA Assay (FDA approved for NASAL specimens in patients 22 years of  age and older), is one component of a comprehensive surveillance program. It is not intended to diagnose infection nor to guide or monitor treatment. Performed at [facility], [address], [place], [place] [address]     Labs: CBC: Recent Labs  Lab 04/02/22 2253 04/05/22 0449  WBC 6.0 6.1  NEUTROABS 2.9  --   HGB 13.6 13.4  HCT 41.0 39.6  MCV 90.9 91.7  PLT 190 167   Basic Metabolic Panel: Recent Labs  Lab 04/02/22 2252 04/03/22 2058 04/05/22 0449  NA 137 140 139  K 3.1* 3.5 3.9  CL 102 109 108  CO2 GLUCOSE 157* 136* 103*  BUN 13 6* 7*  CREATININE 0.80 0.66 0.60  CALCIUM 11.1* 10.2 10.2  MG  --   --  2.4   Liver Function Tests: No results for input(s): "AST", "ALT", "ALKPHOS", "BILITOT", "PROT", "ALBUMIN" in the last 168 hours. CBG: Recent Labs  Lab 04/02/22 2246  GLUCAP 169*    Discharge time spent: greater than 30 minutes.  Signed: Lynden Oxford, MD [facility] 04/05/2022

## 2022-04-09 DIAGNOSIS — R55 Syncope and collapse: Secondary | ICD-10-CM | POA: Diagnosis not present

## 2022-04-09 DIAGNOSIS — S02611D Fracture of condylar process of right mandible, subsequent encounter for fracture with routine healing: Secondary | ICD-10-CM | POA: Diagnosis not present

## 2022-04-09 DIAGNOSIS — Z09 Encounter for follow-up examination after completed treatment for conditions other than malignant neoplasm: Secondary | ICD-10-CM | POA: Diagnosis not present

## 2022-04-10 DIAGNOSIS — R55 Syncope and collapse: Secondary | ICD-10-CM

## 2022-04-10 DIAGNOSIS — Z9289 Personal history of other medical treatment: Secondary | ICD-10-CM | POA: Diagnosis not present

## 2022-04-17 ENCOUNTER — Emergency Department (HOSPITAL_BASED_OUTPATIENT_CLINIC_OR_DEPARTMENT_OTHER)
Admission: EM | Admit: 2022-04-17 | Discharge: 2022-04-18 | Disposition: A | Discharge status: Home / Self Care | Payer: Medicare PPO | Attending: Emergency Medicine | Admitting: Emergency Medicine

## 2022-04-17 ENCOUNTER — Ambulatory Visit
Admission: EM | Admit: 2022-04-17 | Discharge: 2022-04-17 | Disposition: A | Discharge status: Home / Self Care | Payer: Medicare PPO | Attending: Student | Admitting: Student

## 2022-04-17 ENCOUNTER — Encounter (HOSPITAL_BASED_OUTPATIENT_CLINIC_OR_DEPARTMENT_OTHER): Payer: Self-pay

## 2022-04-17 ENCOUNTER — Emergency Department (HOSPITAL_BASED_OUTPATIENT_CLINIC_OR_DEPARTMENT_OTHER): Payer: Medicare PPO

## 2022-04-17 ENCOUNTER — Other Ambulatory Visit: Payer: Self-pay

## 2022-04-17 ENCOUNTER — Emergency Department (HOSPITAL_BASED_OUTPATIENT_CLINIC_OR_DEPARTMENT_OTHER): Payer: Medicare PPO | Admitting: Radiology

## 2022-04-17 DIAGNOSIS — R0602 Shortness of breath: Secondary | ICD-10-CM | POA: Insufficient documentation

## 2022-04-17 DIAGNOSIS — R0789 Other chest pain: Secondary | ICD-10-CM

## 2022-04-17 DIAGNOSIS — Z91040 Latex allergy status: Secondary | ICD-10-CM | POA: Insufficient documentation

## 2022-04-17 DIAGNOSIS — R079 Chest pain, unspecified: Secondary | ICD-10-CM | POA: Diagnosis not present

## 2022-04-17 LAB — BASIC METABOLIC PANEL
Anion gap: 9 (ref 5–15)
BUN: 5 mg/dL — ABNORMAL LOW (ref 8–23)
CO2: 25 mmol/L (ref 22–32)
Calcium: 10.8 mg/dL — ABNORMAL HIGH (ref 8.9–10.3)
Chloride: 107 mmol/L (ref 98–111)
Creatinine, Ser: 0.66 mg/dL (ref 0.44–1.00)
GFR, Estimated: >60 mL/min (ref >60)
Glucose, Bld: 98 mg/dL (ref 70–99)
Potassium: 4.2 mmol/L (ref 3.5–5.1)
Sodium: 141 mmol/L (ref 135–145)

## 2022-04-17 LAB — TROPONIN I (HIGH SENSITIVITY): Troponin I (High Sensitivity): <2 ng/L (ref <18)

## 2022-04-17 LAB — CBC
HCT: 37.6 % (ref 36.0–46.0)
Hemoglobin: 12.6 g/dL (ref 12.0–15.0)
MCH: 30.9 pg (ref 26.0–34.0)
MCHC: 33.5 g/dL (ref 30.0–36.0)
MCV: 92.2 fL (ref 80.0–100.0)
Platelets: 221 10*3/uL (ref 150–400)
RBC: 4.08 MIL/uL (ref 3.87–5.11)
RDW: 12.6 % (ref 11.5–15.5)
WBC: 4.6 10*3/uL (ref 4.0–10.5)
nRBC: 0 % (ref 0.0–0.2)

## 2022-04-17 MED ORDER — IOHEXOL 350 MG/ML SOLN
100.0000 mL | Freq: Once | INTRAVENOUS | Status: AC | PRN
  Administered 2022-04-17: 75 mL via INTRAVENOUS

## 2022-04-17 NOTE — Discharge Instructions (Signed)
Work-up for the chest pain without any acute findings.  CT angio of the chest shows no evidence of any blood clots or any other significant abnormalities.  Raise some question about perhaps pneumonia but you do not have an elevated white blood cell count and fever we will hold off on treating that.  But if you develop these symptoms be required.

## 2022-04-17 NOTE — ED Triage Notes (Signed)
Pt c/o pain midsternal area states it has been hurting since her fall but over the last 1-2 days it has been getting worse.

## 2022-04-17 NOTE — ED Provider Notes (Signed)
[facility] EMERGENCY DEPT Provider Note   CSN: [id number] Arrival date & time: 04/17/22  1946     History  Chief Complaint  Patient presents with   Chest Pain   Shortness of Breath    Gabrielle [name] is a 76 y.o. female.  Patient admitted June 29 through July 2 following syncope episode resulting in a man mandible fracture.  Patient evaluated ear nose and throat.  Patient's had her jaw wired.  Patient stated for the last few days she has noticed some reproducible left anterior chest wall pain.  And patient says probably actually started June 2019 after the pulse of been present for a while.  She had a chest x-ray completed on June 1 without any acute findings.  The pain is persisted.  Patient went to urgent care today and was referred in for evaluation of the left-sided chest wall pain which is reproducible.  Patient states it hurts to move her left arm and it hurts to press on the chest there.  Does not have a documented rib fractures.  States she is doing fine from the mandible repair with the wired jaw so far.  The pain is also worse with taking a deep breath.  She sats here are 98%       Home Medications Prior to Admission medications   Medication Sig Start Date End Date Taking? Authorizing Provider  acetaminophen (TYLENOL) 160 MG/5ML liquid Take 15.6 mLs (500 mg total) by mouth every 4 (four) hours as needed for fever (mild pain). 04/05/22   Rolly Salter, MD  amLODipine (NORVASC) 2.5 MG tablet Take 2.5 mg by mouth at bedtime.    [provider]  chlorhexidine (PERIDEX) 0.12 % solution Use as directed 15 mLs in the mouth or throat 2 (two) times daily. 04/05/22   Rolly Salter, MD  COLLAGEN PO Take 1 capsule by mouth 3 (three) times a week.    [provider]  Cyanocobalamin (VITAMIN B12 PO) Take 1 tablet by mouth daily.    [provider]  diclofenac Sodium (VOLTAREN) 1 % GEL Apply 2 g topically 4 (four) times daily as needed (for pain-  shoulders, hands, and knees).    [provider]  etodolac (LODINE) 400 MG tablet Take 200 mg by mouth at bedtime.    [provider]  ezetimibe (ZETIA) 10 MG tablet Take 10 mg by mouth daily.    [provider]  feeding supplement (ENSURE ENLIVE / ENSURE PLUS) LIQD Take 237 mLs by mouth 2 (two) times daily between meals. 04/05/22   Rolly Salter, MD  Ibuprofen 200 MG CAPS Take 200 mg by mouth every 6 (six) hours as needed (for pain, if not taking Etodolac).    [provider]  Iron-Vitamins (GERITOL COMPLETE) TABS Take 1 tablet by mouth daily with breakfast.    [provider]  levothyroxine (SYNTHROID, LEVOTHROID) 50 MCG tablet Take 50 mcg by mouth daily before breakfast.    [provider]  NON FORMULARY Take 1 tablet by mouth See admin instructions. Magnesium/Zinc/Vitamin C tablets- Take 1 tablet by mouth once a day    [provider]  polyethylene glycol (MIRALAX / GLYCOLAX) 17 g packet Take 17 g by mouth daily as needed for mild constipation. 04/05/22   Rolly Salter, MD  prednisoLONE acetate (PRED FORTE) 1 % ophthalmic suspension Place 1 drop into the right eye See admin instructions. Place 1 drop into the right eye four times daily for 1  week, then three times daily for 1 week, then two times daily for 1 week, then one time daily for 1 week, then stop 03/26/22 05/01/22  [provider]  rosuvastatin (CRESTOR) 5 MG tablet Take 5 mg by mouth every Saturday.    [provider]  timolol (TIMOPTIC) 0.5 % ophthalmic solution Place 1 drop into both eyes daily.    [provider]  ZYRTEC ALLERGY 10 MG tablet Take 10 mg by mouth daily.    [provider]      Allergies    Lidocaine, Aspirin, Hylan g-f 20, Lisinopril, Losartan, Metoprolol, Olmesartan, Codeine, Latex, and Sulfa antibiotics    Review of Systems   Review of Systems  Constitutional:  Negative for chills and fever.  HENT:  Negative for ear  pain and sore throat.   Eyes:  Negative for pain and visual disturbance.  Respiratory:  Negative for cough and shortness of breath.   Cardiovascular:  Positive for chest pain. Negative for palpitations.  Gastrointestinal:  Negative for abdominal pain and vomiting.  Genitourinary:  Negative for dysuria and hematuria.  Musculoskeletal:  Negative for arthralgias and back pain.  Skin:  Negative for color change and rash.  Neurological:  Negative for seizures and syncope.  All other systems reviewed and are negative.   Physical Exam Updated Vital Signs BP (!) 150/71   Pulse 63   Temp 98.4 F (36.9 C) (Oral)   Resp 16   Ht 1.638 m (5' 4.49")   Wt 72.1 kg   SpO2 100%   BMI 26.88 kg/m  Physical Exam Vitals and nursing note reviewed.  Constitutional:      General: She is not in acute distress.    Appearance: She is well-developed.  HENT:     Head: Normocephalic and atraumatic.     Comments: Mandible is wired. Eyes:     Conjunctiva/sclera: Conjunctivae normal.  Cardiovascular:     Rate and Rhythm: Normal rate and regular rhythm.     Heart sounds: No murmur heard. Pulmonary:     Effort: Pulmonary effort is normal. No respiratory distress.     Breath sounds: Normal breath sounds. No decreased breath sounds, wheezing, rhonchi or rales.     Comments: Tenderness to palpation left anterior ribs where ribs with joint with cartilage anteriorly Abdominal:     Palpations: Abdomen is soft.     Tenderness: There is no abdominal tenderness.  Musculoskeletal:        General: No swelling.     Cervical back: Neck supple.     Right lower leg: No edema.  Skin:    General: Skin is warm and dry.     Capillary Refill: Capillary refill takes less than 2 seconds.  Neurological:     General: No focal deficit present.     Mental Status: She is alert and oriented to person, place, and time.  Psychiatric:        Mood and Affect: Mood normal.     ED Results / Procedures / Treatments    Labs (all labs ordered are listed, but only abnormal results are displayed) Labs Reviewed  BASIC METABOLIC PANEL - Abnormal; Notable for the following components:      Result Value   BUN 5 (*)    Calcium 10.8 (*)    All other components within normal limits  CBC  TROPONIN I (HIGH SENSITIVITY)  TROPONIN I (HIGH SENSITIVITY)    EKG EKG Interpretation  Date/Time:  Friday April 17 2022 20:01:15 EDT  Ventricular Rate:  61 PR Interval:  171 QRS Duration: 91 QT Interval:  408 QTC Calculation: 411 R Axis:   45 Text Interpretation: Sinus rhythm Confirmed by Vanetta Mulders [phone]) on 04/17/2022 8:31:51 PM  Radiology CT Angio Chest PE W/Cm &/Or Wo Cm  Result Date: 04/17/2022 CLINICAL DATA:  Chest discomfort with pulmonary embolism suspected. High probability. EXAM: CT ANGIOGRAPHY CHEST WITH CONTRAST TECHNIQUE: Multidetector CT imaging of the chest was performed using the standard protocol during bolus administration of intravenous contrast. Multiplanar CT image reconstructions and MIPs were obtained to evaluate the vascular anatomy. RADIATION DOSE REDUCTION: This exam was performed according to the departmental dose-optimization program which includes automated exposure control, adjustment of the mA and/or kV according to patient size and/or use of iterative reconstruction technique. CONTRAST:  52mL OMNIPAQUE IOHEXOL 350 MG/ML SOLN COMPARISON:  PA and lateral chest today, PA and lateral chest 04/04/2022. FINDINGS: Cardiovascular: The pulmonary arteries are normal in caliber. No arterial embolus is seen. Small amount of scattered aortic atherosclerosis noted without aneurysm or dissection. The great vessels are patent. There is mild cardiomegaly, left chamber predominance and no IVC reflux or evidence of acute right heart strain. Scattered calcification noted LAD coronary artery. There is a minimal pericardial effusion anteriorly to the right. Pulmonary veins are normal caliber. Mediastinum/Nodes:  Thyroid gland obscured by spray artifact from dense contrast in the left subclavian vein. Axillary spaces are clear. There is no intrathoracic adenopathy. The trachea and main bronchi are clear. Thoracic esophagus unremarkable. Lungs/Pleura: There are trace pleural effusions. No pleural thickening or pneumothorax. In the posterior basal lower lobes there are scattered small bronchiolar impactions on both sides compatible with small airways disease, with mild central bronchial thickening in the upper and lower lobes. Also in the lower lobes there are scattered ground-glass opacities intermixed with coarse intersecting linear atelectatic markings, which could all be due to atelectasis or could be due to a combination of atelectasis and pneumonia. The upper and right middle lobes are clear. There is no pulmonary nodule or dense airspace consolidation. Upper Abdomen: No acute abnormality. There is a 3 cm cyst in the upper pole left kidney 2.3 cm cyst in the upper pole of the right. Musculoskeletal: osteopenia with degenerative disc disease and spondylosis thoracic spine, mild thoracic kyphosis. The ribcage is intact. Review of the MIP images confirms the above findings. IMPRESSION: 1. No evidence of pulmonary arterial dilatation or embolus. 2. Cardiomegaly with CAD and aortic atherosclerosis. No findings of acute CHF. 3. Both ground-glass and coarse linear atelectatic markings are noted in the lower lobes. This could all be due to atelectasis or could be a combination of atelectasis and pneumonia. 4. Scattered small bronchiolar impactions in both posterior basal lower lobes. Evidence of central bronchitis. 5. Renal cysts. Electronically Signed   By: Almira Bar M.D.   On: 04/17/2022 22:21   DG Chest 2 View  Result Date: 04/17/2022 CLINICAL DATA:  Chest pain EXAM: CHEST - 2 VIEW COMPARISON:  04/04/2022 FINDINGS: Mild bibasilar atelectasis or scarring. Pulmonary insufflation is stable and normal. No superimposed  confluent pulmonary infiltrate. No pneumothorax or pleural effusion. Cardiac size within normal limits. Pulmonary vascularity is normal. No acute bone abnormality. IMPRESSION: Mild bibasilar atelectasis or scarring. Electronically Signed   By: Helyn Numbers M.D.   On: 04/17/2022 20:43    Procedures Procedures    Medications Ordered in ED Medications  iohexol (OMNIPAQUE) 350 MG/ML injection 100 mL (75 mLs Intravenous Contrast Given 04/17/22 2149)    ED Course/  Medical Decision Making/ A&P                           Medical Decision Making Amount and/or Complexity of Data Reviewed Labs: ordered. Radiology: ordered.  Risk Prescription drug management.   Patient with reproducible left anterior chest pain no crepitance.  Is also made worse by moving her left arm or taking a deep breath.  Patient referred from urgent care.  Troponin here less than 2.  Delta troponin is not necessary this is been ongoing for a number of days.  Patient metabolic panel normal, CBC normal.  X-ray without any acute findings.  Based on patient's symptoms when had did CT angio to make sure no pulmonary embolus because she was hospitalized following the fall.  That was negative for PE.  Recent question of may be some early pneumonia the patient does not have a fever does not have a white count does not seem to have any symptoms consistent with that.  Patient told about this and if any of these develop she will get seen again.  Patient has a pain medication at home which she does not like taking it.  I recommended extra strength Tylenol and Motrin. Final Clinical Impression(s) / ED Diagnoses Final diagnoses:  Chest wall pain    Rx / DC Orders ED Discharge Orders     None         Vanetta Mulders, MD 04/17/22 2350

## 2022-04-17 NOTE — ED Triage Notes (Addendum)
Pt presents to the ED with left sided CP that started June 29th after a fall, but worsened the last couple of days. Also reports pain with deep breaths. CP reproducible. Chest xray completed on 04/04/22. Lung sounds clear bilaterally. EKG obtained

## 2022-04-17 NOTE — Discharge Instructions (Addendum)
-  Your EKG looks normal today, but this does not rule out a heart attack or acute cardiac event.  You should head to the emergency department to make sure that you do not have any changes to the heart or the lungs.  Please have your daughter drive you straight there, if symptoms change on the way stop and call 911.

## 2022-04-17 NOTE — ED Provider Notes (Signed)
[facility]    CSN: [id number] Arrival date & time: 04/17/22  1808      History   Chief Complaint Chief Complaint  Patient presents with   chest discomfort    HPI Gabrielle [name] is a 76 y.o. female presenting with substernal chest pain.  History hypertension, hyperlipidemia, hypokalemia.  She was hospitalized for syncope and collapse and closed fracture of mandible 04/03/2022.  She actually has a cardiac monitor in place today.  Describes 2 days of substernal chest pain, worse with movement and leaning forward.  There is pain with deep inspiration, but denies dyspnea at rest.  Denies dizziness, weakness, focal weakness, headaches.  Taking her medications as directed.  HPI  Past Medical History:  Diagnosis Date   Hypertension    Thyroid disease     Patient Active Problem List   Diagnosis Date Noted   Hypothyroidism 04/04/2022   Syncope and collapse 04/03/2022   Closed fracture of right side of mandible (HCC) 04/03/2022   HTN (hypertension) 04/03/2022   Hypokalemia 04/03/2022   Hypercalcemia 04/03/2022   Hyperlipidemia 04/03/2022    Past Surgical History:  Procedure Laterality Date   ABDOMINAL HYSTERECTOMY     BREAST EXCISIONAL BIOPSY Left    CLOSED REDUCTION MANDIBLE WITH MANDIBULOMA N/A 04/05/2022   Procedure: CLOSED REDUCTION MANDIBLE WITH MANDIBULOMAXILLARY FUSION;  Surgeon: Exie Parody, DMD;  Location: [facility] OR;  Service: Oral Surgery;  Laterality: N/A;   FOOT SURGERY  2019   Arch reconstruction   SHOULDER SURGERY      OB History   No obstetric history on file.      Home Medications    Prior to Admission medications   Medication Sig Start Date End Date Taking? Authorizing Provider  acetaminophen (TYLENOL) 160 MG/5ML liquid Take 15.6 mLs (500 mg total) by mouth every 4 (four) hours as needed for fever (mild pain). 04/05/22   Rolly Salter, MD  amLODipine (NORVASC) 2.5 MG tablet Take 2.5 mg by mouth at bedtime.    [provider]   chlorhexidine (PERIDEX) 0.12 % solution Use as directed 15 mLs in the mouth or throat 2 (two) times daily. 04/05/22   Rolly Salter, MD  COLLAGEN PO Take 1 capsule by mouth 3 (three) times a week.    [provider]  Cyanocobalamin (VITAMIN B12 PO) Take 1 tablet by mouth daily.    [provider]  diclofenac Sodium (VOLTAREN) 1 % GEL Apply 2 g topically 4 (four) times daily as needed (for pain- shoulders, hands, and knees).    [provider]  etodolac (LODINE) 400 MG tablet Take 200 mg by mouth at bedtime.    [provider]  ezetimibe (ZETIA) 10 MG tablet Take 10 mg by mouth daily.    [provider]  feeding supplement (ENSURE ENLIVE / ENSURE PLUS) LIQD Take 237 mLs by mouth 2 (two) times daily between meals. 04/05/22   Rolly Salter, MD  Ibuprofen 200 MG CAPS Take 200 mg by mouth every 6 (six) hours as needed (for pain, if not taking Etodolac).    [provider]  Iron-Vitamins (GERITOL COMPLETE) TABS Take 1 tablet by mouth daily with breakfast.    [provider]  levothyroxine (SYNTHROID, LEVOTHROID) 50 MCG tablet Take 50 mcg by mouth daily before breakfast.    [provider]  NON FORMULARY Take 1 tablet by mouth See admin instructions. Magnesium/Zinc/Vitamin C tablets- Take 1 tablet by mouth once a day    [provider]  polyethylene glycol (MIRALAX / GLYCOLAX) 17 g packet Take 17 g by mouth daily as needed for mild constipation. 04/05/22   Rolly Salter, MD  prednisoLONE acetate (PRED FORTE) 1 % ophthalmic suspension Place 1 drop into the right eye See admin instructions. Place 1 drop into the right eye four times daily for 1 week, then three times daily for 1 week, then two times daily for 1 week, then one time daily for 1 week, then stop 03/26/22 05/01/22  [provider]  rosuvastatin (CRESTOR) 5 MG tablet Take 5 mg by mouth every Saturday.    [provider]  timolol (TIMOPTIC) 0.5 %  ophthalmic solution Place 1 drop into both eyes daily.    [provider]  ZYRTEC ALLERGY 10 MG tablet Take 10 mg by mouth daily.    [provider]    Family History Family History  Problem Relation Age of Onset   Diabetes Father    Cancer Father    Hypertension Father     Social History Social History   Tobacco Use   Smoking status: Never   Smokeless tobacco: Never  Substance Use Topics   Alcohol use: No   Drug use: Never     Allergies   Lidocaine, Aspirin, Hylan g-f 20, Lisinopril, Losartan, Metoprolol, Olmesartan, Codeine, Latex, and Sulfa antibiotics   Review of Systems Review of Systems  Cardiovascular:  Positive for chest pain.  All other systems reviewed and are negative.    Physical Exam Triage Vital Signs ED Triage Vitals [04/17/22 1817]  Enc Vitals Group     BP (!) 149/76     Pulse Rate 73     Resp 18     Temp 98.7 F (37.1 C)     Temp Source Oral     SpO2 94 %     Weight      Height      Head Circumference      Peak Flow      Pain Score 0     Pain Loc      Pain Edu?      Excl. in GC?    No data found.  Updated Vital Signs BP (!) 149/76 (BP Location: Left Arm)   Pulse 73   Temp 98.7 F (37.1 C) (Oral)   Resp 18   SpO2 94%   Visual Acuity Right Eye Distance:   Left Eye Distance:   Bilateral Distance:    Right Eye Near:   Left Eye Near:    Bilateral Near:     Physical Exam Vitals reviewed.  Constitutional:      General: She is not in acute distress.    Appearance: Normal appearance. She is not ill-appearing or diaphoretic.  HENT:     Head: Normocephalic and atraumatic.     Mouth/Throat:     Mouth: Mucous membranes are moist.  Eyes:     Extraocular Movements: Extraocular movements intact.     Pupils: Pupils are equal, round, and reactive to light.  Cardiovascular:     Rate and Rhythm: Normal rate and regular rhythm.     Pulses:          Radial pulses are 2+ on the right side and 2+ on the left side.      Heart sounds: Normal heart sounds.  Pulmonary:     Effort: Pulmonary effort is normal.     Breath sounds: Normal breath sounds.  Chest:     Chest wall:  Tenderness present.     Comments: TTP sternum  Abdominal:     Palpations: Abdomen is soft.     Tenderness: There is no abdominal tenderness. There is no guarding or rebound.  Musculoskeletal:     Right lower leg: No edema.     Left lower leg: No edema.  Skin:    General: Skin is warm.     Capillary Refill: Capillary refill takes less than 2 seconds.  Neurological:     General: No focal deficit present.     Mental Status: She is alert and oriented to person, place, and time.  Psychiatric:        Mood and Affect: Mood normal.        Behavior: Behavior normal.        Thought Content: Thought content normal.        Judgment: Judgment normal.      UC Treatments / Results  Labs (all labs ordered are listed, but only abnormal results are displayed) Labs Reviewed - No data to display  EKG   Radiology No results found.  Procedures Procedures (including critical care time)  Medications Ordered in UC Medications - No data to display  Initial Impression / Assessment and Plan / UC Course  I have reviewed the triage vital signs and the nursing notes.  Pertinent labs & imaging results that were available during my care of the patient were reviewed by me and considered in my medical decision making (see chart for details).     This patient is a very pleasant 76 y.o. year old female presenting with chest wall pain. Afebrile, nontachy. Breath sounds clear to auscultation.  This patient was hospitalized for syncope and collapse 04/02/22 and is currently wearing a heart monitor. EKG NSR.   Discussed that while it is a reassuring sign that the EKG is normal and the chest pain is reproducible, I cannot exclude acute cardiac pathology in the urgent care setting.  I also suspect she would benefit from a chest x-ray given recent fall,  and we unfortunately do not have x-ray onsite tonight (given time of day, will not get outpatient results back tonight).  I strongly advised she head to the emergency department for troponins, chest x-ray, cardiac monitoring.  She is in agreement.  She drove herself here, so we then monitored her for 20 minutes while her son came to pick her up.  Final Clinical Impressions(s) / UC Diagnoses   Final diagnoses:  Chest wall pain     Discharge Instructions      -Your EKG looks normal today, but this does not rule out a heart attack or acute cardiac event.  You should head to the emergency department to make sure that you do not have any changes to the heart or the lungs.  Please have your daughter drive you straight there, if symptoms change on the way stop and call 911.     ED Prescriptions   None    PDMP not reviewed this encounter.   Rhys Martini, PA-C 04/17/22 1843

## 2022-04-17 NOTE — ED Notes (Signed)
Patient is being discharged from the Urgent Care and sent to the Emergency Department via POV . Per Ignacia Bayley PA, patient is in need of higher level of care due to chest pain. Patient is aware and verbalizes understanding of plan of care.  Vitals:   04/17/22 1817  BP: (!) 149/76  Pulse: 73  Resp: 18  Temp: 98.7 F (37.1 C)  SpO2: 94%

## 2022-05-01 DIAGNOSIS — M1711 Unilateral primary osteoarthritis, right knee: Secondary | ICD-10-CM | POA: Diagnosis not present

## 2022-05-01 DIAGNOSIS — M25561 Pain in right knee: Secondary | ICD-10-CM | POA: Diagnosis not present

## 2022-05-01 DIAGNOSIS — M1712 Unilateral primary osteoarthritis, left knee: Secondary | ICD-10-CM | POA: Diagnosis not present

## 2022-05-01 DIAGNOSIS — M170 Bilateral primary osteoarthritis of knee: Secondary | ICD-10-CM | POA: Diagnosis not present

## 2022-05-04 DIAGNOSIS — M180 Bilateral primary osteoarthritis of first carpometacarpal joints: Secondary | ICD-10-CM | POA: Diagnosis not present

## 2022-05-24 DIAGNOSIS — R32 Unspecified urinary incontinence: Secondary | ICD-10-CM | POA: Diagnosis not present

## 2022-05-24 DIAGNOSIS — Z8249 Family history of ischemic heart disease and other diseases of the circulatory system: Secondary | ICD-10-CM | POA: Diagnosis not present

## 2022-05-24 DIAGNOSIS — Z833 Family history of diabetes mellitus: Secondary | ICD-10-CM | POA: Diagnosis not present

## 2022-05-24 DIAGNOSIS — E785 Hyperlipidemia, unspecified: Secondary | ICD-10-CM | POA: Diagnosis not present

## 2022-05-24 DIAGNOSIS — Z823 Family history of stroke: Secondary | ICD-10-CM | POA: Diagnosis not present

## 2022-05-24 DIAGNOSIS — I10 Essential (primary) hypertension: Secondary | ICD-10-CM | POA: Diagnosis not present

## 2022-05-24 DIAGNOSIS — H409 Unspecified glaucoma: Secondary | ICD-10-CM | POA: Diagnosis not present

## 2022-05-24 DIAGNOSIS — Z809 Family history of malignant neoplasm, unspecified: Secondary | ICD-10-CM | POA: Diagnosis not present

## 2022-05-24 DIAGNOSIS — E039 Hypothyroidism, unspecified: Secondary | ICD-10-CM | POA: Diagnosis not present

## 2022-05-25 DIAGNOSIS — H409 Unspecified glaucoma: Secondary | ICD-10-CM | POA: Diagnosis not present

## 2022-05-25 DIAGNOSIS — H25812 Combined forms of age-related cataract, left eye: Secondary | ICD-10-CM | POA: Diagnosis not present

## 2022-05-25 DIAGNOSIS — H269 Unspecified cataract: Secondary | ICD-10-CM | POA: Diagnosis not present

## 2022-05-25 DIAGNOSIS — Z01818 Encounter for other preprocedural examination: Secondary | ICD-10-CM | POA: Diagnosis not present

## 2022-05-25 DIAGNOSIS — H402222 Chronic angle-closure glaucoma, left eye, moderate stage: Secondary | ICD-10-CM | POA: Diagnosis not present

## 2022-07-29 ENCOUNTER — Other Ambulatory Visit: Payer: Self-pay | Admitting: Family Medicine

## 2022-07-29 DIAGNOSIS — Z1231 Encounter for screening mammogram for malignant neoplasm of breast: Secondary | ICD-10-CM

## 2022-08-03 ENCOUNTER — Other Ambulatory Visit: Payer: Self-pay | Admitting: Family Medicine

## 2022-08-03 DIAGNOSIS — Q839 Congenital malformation of breast, unspecified: Secondary | ICD-10-CM

## 2022-08-25 DIAGNOSIS — M7542 Impingement syndrome of left shoulder: Secondary | ICD-10-CM | POA: Diagnosis not present

## 2022-08-25 DIAGNOSIS — M25512 Pain in left shoulder: Secondary | ICD-10-CM | POA: Diagnosis not present

## 2022-08-25 DIAGNOSIS — M25511 Pain in right shoulder: Secondary | ICD-10-CM | POA: Diagnosis not present

## 2022-08-25 DIAGNOSIS — M7541 Impingement syndrome of right shoulder: Secondary | ICD-10-CM | POA: Diagnosis not present

## 2022-09-02 DIAGNOSIS — H402232 Chronic angle-closure glaucoma, bilateral, moderate stage: Secondary | ICD-10-CM | POA: Diagnosis not present

## 2022-09-10 DIAGNOSIS — M533 Sacrococcygeal disorders, not elsewhere classified: Secondary | ICD-10-CM | POA: Diagnosis not present

## 2022-09-10 DIAGNOSIS — M25551 Pain in right hip: Secondary | ICD-10-CM | POA: Diagnosis not present

## 2022-09-10 DIAGNOSIS — M7918 Myalgia, other site: Secondary | ICD-10-CM | POA: Diagnosis not present

## 2022-09-10 DIAGNOSIS — M1611 Unilateral primary osteoarthritis, right hip: Secondary | ICD-10-CM | POA: Diagnosis not present

## 2022-09-14 ENCOUNTER — Ambulatory Visit
Admission: RE | Admit: 2022-09-14 | Discharge: 2022-09-14 | Disposition: A | Discharge status: Home / Self Care | Payer: Medicare PPO | Source: Ambulatory Visit | Attending: Family Medicine | Admitting: Family Medicine

## 2022-09-14 DIAGNOSIS — M8589 Other specified disorders of bone density and structure, multiple sites: Secondary | ICD-10-CM | POA: Diagnosis not present

## 2022-09-14 DIAGNOSIS — Z1231 Encounter for screening mammogram for malignant neoplasm of breast: Secondary | ICD-10-CM

## 2022-09-14 DIAGNOSIS — Z780 Asymptomatic menopausal state: Secondary | ICD-10-CM | POA: Diagnosis not present

## 2022-09-16 DIAGNOSIS — E7800 Pure hypercholesterolemia, unspecified: Secondary | ICD-10-CM | POA: Diagnosis not present

## 2022-09-16 DIAGNOSIS — I10 Essential (primary) hypertension: Secondary | ICD-10-CM | POA: Diagnosis not present

## 2022-09-16 DIAGNOSIS — E039 Hypothyroidism, unspecified: Secondary | ICD-10-CM | POA: Diagnosis not present

## 2022-09-16 DIAGNOSIS — M170 Bilateral primary osteoarthritis of knee: Secondary | ICD-10-CM | POA: Diagnosis not present

## 2022-09-16 DIAGNOSIS — R8290 Unspecified abnormal findings in urine: Secondary | ICD-10-CM | POA: Diagnosis not present

## 2022-09-16 DIAGNOSIS — Z23 Encounter for immunization: Secondary | ICD-10-CM | POA: Diagnosis not present

## 2022-09-18 ENCOUNTER — Ambulatory Visit
Admission: RE | Admit: 2022-09-18 | Discharge: 2022-09-18 | Disposition: A | Discharge status: Home / Self Care | Payer: Medicare PPO | Source: Ambulatory Visit | Attending: Family Medicine | Admitting: Family Medicine

## 2022-09-18 DIAGNOSIS — N6489 Other specified disorders of breast: Secondary | ICD-10-CM | POA: Diagnosis not present

## 2022-09-18 DIAGNOSIS — Q839 Congenital malformation of breast, unspecified: Secondary | ICD-10-CM

## 2022-09-18 DIAGNOSIS — R928 Other abnormal and inconclusive findings on diagnostic imaging of breast: Secondary | ICD-10-CM | POA: Diagnosis not present

## 2022-10-12 DIAGNOSIS — M25661 Stiffness of right knee, not elsewhere classified: Secondary | ICD-10-CM | POA: Diagnosis not present

## 2022-10-12 DIAGNOSIS — M1611 Unilateral primary osteoarthritis, right hip: Secondary | ICD-10-CM | POA: Diagnosis not present

## 2022-10-12 DIAGNOSIS — R21 Rash and other nonspecific skin eruption: Secondary | ICD-10-CM | POA: Diagnosis not present

## 2022-10-12 DIAGNOSIS — M5431 Sciatica, right side: Secondary | ICD-10-CM | POA: Diagnosis not present

## 2022-10-12 DIAGNOSIS — M25561 Pain in right knee: Secondary | ICD-10-CM | POA: Diagnosis not present

## 2022-10-12 DIAGNOSIS — M1711 Unilateral primary osteoarthritis, right knee: Secondary | ICD-10-CM | POA: Diagnosis not present

## 2022-10-12 NOTE — H&P (Signed)
TOTAL KNEE ADMISSION H&P  Patient is being admitted for right total knee arthroplasty.  Subjective:  Chief Complaint: Right knee pain.  HPI: Gabrielle [name], 77 y.o. female has a history of pain and functional disability in the right knee due to arthritis and has failed non-surgical conservative treatments for greater than 12 weeks to include NSAID's and/or analgesics, corticosteriod injections, flexibility and strengthening excercises, and activity modification. Onset of symptoms was gradual, starting several years ago with gradually worsening course since that time. The patient noted no past surgery on the right knee.  Patient currently rates pain in the right knee at 7 out of 10 with activity. Patient has night pain, worsening of pain with activity and weight bearing, and pain that interferes with activities of daily living. Patient has evidence of  significant bone-on-bone arthritis in the lateral and patellofemoral compartments, both with significant medial narrowing  by imaging studies. There is no active infection.  Patient Active Problem List   Diagnosis Date Noted   Hypothyroidism 04/04/2022   Syncope and collapse 04/03/2022   Closed fracture of right side of mandible ([place]) 04/03/2022   HTN (hypertension) 04/03/2022   Hypokalemia 04/03/2022   Hypercalcemia 04/03/2022   Hyperlipidemia 04/03/2022    Past Medical History:  Diagnosis Date   Hypertension    Thyroid disease     Past Surgical History:  Procedure Laterality Date   ABDOMINAL HYSTERECTOMY     BREAST EXCISIONAL BIOPSY Left    CLOSED REDUCTION MANDIBLE WITH MANDIBULOMA N/A 04/05/2022   Procedure: CLOSED REDUCTION MANDIBLE WITH MANDIBULOMAXILLARY FUSION;  Surgeon: Gardner Candle, DMD;  Location: [facility];  Service: Oral Surgery;  Laterality: N/A;   FOOT SURGERY  2019   Arch reconstruction   SHOULDER SURGERY      Prior to Admission medications   Medication Sig Start Date End Date Taking? Authorizing Provider   acetaminophen (TYLENOL) 160 MG/5ML liquid Take 15.6 mLs (500 mg total) by mouth every 4 (four) hours as needed for fever (mild pain). 04/05/22   Lavina Hamman, MD  amLODipine (NORVASC) 2.5 MG tablet Take 2.5 mg by mouth at bedtime.    [provider]  chlorhexidine (PERIDEX) 0.12 % solution Use as directed 15 mLs in the mouth or throat 2 (two) times daily. 04/05/22   Lavina Hamman, MD  COLLAGEN PO Take 1 capsule by mouth 3 (three) times a week.    [provider]  Cyanocobalamin (VITAMIN B12 PO) Take 1 tablet by mouth daily.    [provider]  diclofenac Sodium (VOLTAREN) 1 % GEL Apply 2 g topically 4 (four) times daily as needed (for pain- shoulders, hands, and knees).    [provider]  etodolac (LODINE) 400 MG tablet Take 200 mg by mouth at bedtime.    [provider]  ezetimibe (ZETIA) 10 MG tablet Take 10 mg by mouth daily.    [provider]  feeding supplement (ENSURE ENLIVE / ENSURE PLUS) LIQD Take 237 mLs by mouth 2 (two) times daily between meals. 04/05/22   Lavina Hamman, MD  Ibuprofen 200 MG CAPS Take 200 mg by mouth every 6 (six) hours as needed (for pain, if not taking Etodolac).    [provider]  Iron-Vitamins (GERITOL COMPLETE) TABS Take 1 tablet by mouth daily with breakfast.    [provider]  levothyroxine (SYNTHROID, LEVOTHROID) 50 MCG tablet Take 50 mcg by mouth daily before breakfast.    [provider]  NON FORMULARY Take 1 tablet  by mouth See admin instructions. Magnesium/Zinc/Vitamin C tablets- Take 1 tablet by mouth once a day    [provider]  polyethylene glycol (MIRALAX / GLYCOLAX) 17 g packet Take 17 g by mouth daily as needed for mild constipation. 04/05/22   Rolly Salter, MD  rosuvastatin (CRESTOR) 5 MG tablet Take 5 mg by mouth every Saturday.    [provider]  timolol (TIMOPTIC) 0.5 % ophthalmic solution Place 1 drop into both eyes daily.    [provider]  ZYRTEC ALLERGY 10 MG tablet Take 10 mg by mouth daily.    [provider]    Allergies  Allergen Reactions   Lidocaine Swelling and Other (See Comments)    Twitching, facial swelling, and shaking     Aspirin Other (See Comments)    Increases heart rate    Hylan G-F 20 Swelling and Other (See Comments)    Decreased facial feeling and chills- also   Lisinopril Other (See Comments) and Cough    Dry cough   Losartan Other (See Comments)    Dizziness/headaches   Metoprolol Other (See Comments)    Dizziness   Olmesartan Other (See Comments)    Hair loss   Codeine Hives, Rash and Other (See Comments)    Hallucinations and syncope, also   Latex Rash   Sulfa Antibiotics Hives and Rash    Social History   Socioeconomic History   Marital status: Married    Spouse name: Not on file   Number of children: Not on file   Years of education: Not on file   Highest education level: Not on file  Occupational History   Not on file  Tobacco Use   Smoking status: Never   Smokeless tobacco: Never  Substance and Sexual Activity   Alcohol use: No   Drug use: Never   Sexual activity: Never  Other Topics Concern   Not on file  Social History Narrative   Not on file   Social Determinants of Health   Financial Resource Strain: Not on file  Food Insecurity: Not on file  Transportation Needs: Not on file  Physical Activity: Not on file  Stress: Not on file  Social Connections: Not on file  Intimate Partner Violence: Not on file    Tobacco Use: Low Risk  (04/17/2022)   Patient History    Smoking Tobacco Use: Never    Smokeless Tobacco Use: Never    Passive Exposure: Not on file   Social History   Substance and Sexual Activity  Alcohol Use No    Family History  Problem Relation Age of Onset   Diabetes Father    Cancer Father    Hypertension Father     Review of Systems  Constitutional:  Negative for chills and fever.  HENT: Negative.    Eyes:  Negative.   Respiratory:  Negative for cough and shortness of breath.   Cardiovascular:  Negative for chest pain and palpitations.  Gastrointestinal:  Negative for abdominal pain, constipation, diarrhea, nausea and vomiting.  Genitourinary:  Negative for dysuria, frequency and urgency.  Musculoskeletal:  Positive for joint pain.  Skin:  Negative for rash.   Objective:  Physical Exam: Well nourished and well developed.  General: Alert and oriented x3, cooperative and pleasant, no acute distress.  Head: normocephalic, atraumatic, neck supple.  Eyes: EOMI.  Abdomen: non-tender to palpation and soft, normoactive bowel sounds. Musculoskeletal: Bilateral Hip Exam:  The range of motion: normal without discomfort.   Right  Knee Exam:  No effusion present. No swelling present.  The range of motion is: 0 to 130 degrees.  Moderate crepitus on range of motion of the knee.  Positive medial greater than lateral joint line tenderness.  The knee is stable.   Left Knee Exam:  No effusion present. No swelling present.  The Range of motion is: 0 to 125 degrees.  Moderate crepitus on range of motion of the knee.  Positive medial joint line tenderness.  Positive lateral joint line tenderness.  The knee is stable.  Calves soft and nontender. Motor function intact in LE. Strength 5/5 LE bilaterally. Neuro: Distal pulses 2+. Sensation to light touch intact in LE.  Vital signs in last 24 hours: BP: ()/()  Arterial Line BP: ()/()   Imaging Review Plain radiographs demonstrate moderate degenerative joint disease of the right knee. The overall alignment is neutral. The bone quality appears to be adequate for age and reported activity level.  Assessment/Plan:  End stage arthritis, right knee   The patient history, physical examination, clinical judgment of the provider and imaging studies are consistent with end stage degenerative joint disease of the right knee and total knee arthroplasty is  deemed medically necessary. The treatment options including medical management, injection therapy arthroscopy and arthroplasty were discussed at length. The risks and benefits of total knee arthroplasty were presented and reviewed. The risks due to aseptic loosening, infection, stiffness, patella tracking problems, thromboembolic complications and other imponderables were discussed. The patient acknowledged the explanation, agreed to proceed with the plan and consent was signed. Patient is being admitted for inpatient treatment for surgery, pain control, PT, OT, prophylactic antibiotics, VTE prophylaxis, progressive ambulation and ADLs and discharge planning. The patient is planning to be discharged  home .   Patient's anticipated LOS is less than 2 midnights, meeting these requirements: - Lives within 1 hour of care - Has a competent adult at home to recover with post-op - NO history of  - Chronic pain requiring opioids  - Diabetes  - Coronary Artery Disease  - Heart failure  - Heart attack  - Stroke  - DVT/VTE  - Cardiac arrhythmia  - Respiratory Failure/COPD  - Renal failure  - Anemia  - Advanced Liver disease  Therapy Plans: EO Disposition: Home with Son Planned DVT Prophylaxis: Xarelto 10 mg DME Needed: None PCP: Elias Else, MD (clearance received) TXA: IV Allergies: aspirin (tremor), codeine (tremor), lidocaine (facial tremor), sulfa antibiotics, LATEX (hives) Anesthesia Concerns: None BMI: Last HgbA1c: not diabetic  Pharmacy: [facility] ([address])  Other: -discussed dilaudid post-op  - Patient was instructed on what medications to stop prior to surgery. - Follow-up visit in 2 weeks with Dr. Lequita Halt - Begin physical therapy following surgery - Pre-operative lab work as pre-surgical testing - Prescriptions will be provided in hospital at time of discharge  R. Arcola Jansky, PA-C Orthopedic Surgery [facility] [place]

## 2022-10-16 NOTE — Progress Notes (Addendum)
Anesthesia Review:  PCP: Maury Dus  Clearance dated 07/24/22 on chart LOV 04/09/22 wutg Veneda Melter on chart , OV of 03/23/22 on chart  Cardiologist : Chest x-ray  04/17/22  EKG : 04/27/22  Monitor- 04/29/22  Echo  04/04/22  Stress test: Cardiac Cath :  Activity level:  Sleep Study/ CPAP : Fasting Blood Sugar :      / Checks Blood Sugar -- times a day:   Blood Thinner/ Instructions /Last Dose: ASA / Instructions/ Last Dose :

## 2022-10-16 NOTE — Patient Instructions (Signed)
SURGICAL WAITING ROOM VISITATION  Patients having surgery or a procedure may have no more than 2 support people in the waiting area - these visitors may rotate.    Children under the age of 64 must have an adult with them who is not the patient.  Due to an increase in RSV and influenza rates and associated hospitalizations, children ages 49 and under may not visit patients in [place].  If the patient needs to stay at the hospital during part of their recovery, the visitor guidelines for inpatient rooms apply. Pre-op nurse will coordinate an appropriate time for 1 support person to accompany patient in pre-op.  This support person may not rotate.    Please refer to the [facility] website for the visitor guidelines for Inpatients (after your surgery is over and you are in a regular room).       Your procedure is scheduled on:  10/26/2022    Report to [facility] Main Entrance    Report to admitting at   0700AM   Call this number if you have problems the morning of surgery [phone]   Do not eat food :After Midnight.   After Midnight you may have the following liquids until __ 0615____ AM DAY OF SURGERY  Water Non-Citrus Juices (without pulp, NO RED-Apple, White grape, White cranberry) Black Coffee (NO MILK/CREAM OR CREAMERS, sugar ok)  Clear Tea (NO MILK/CREAM OR CREAMERS, sugar ok) regular and decaf                             Plain Jell-O (NO RED)                                           Fruit ices (not with fruit pulp, NO RED)                                     Popsicles (NO RED)                                                               Sports drinks like Gatorade (NO RED)                    The day of surgery:  Drink ONE (1) Pre-Surgery Clear Ensure or G2 at   1941DE ( have completed by )  the morning of surgery. Drink in one sitting. Do not sip.  This drink was given to you during your hospital  pre-op appointment visit. Nothing else to  drink after completing the  Pre-Surgery Clear Ensure or G2.          If you have questions, please contact your surgeon's office.       Oral Hygiene is also important to reduce your risk of infection.                                    Remember - BRUSH YOUR TEETH THE MORNING OF SURGERY WITH  YOUR REGULAR TOOTHPASTE  DENTURES WILL BE REMOVED PRIOR TO SURGERY PLEASE DO NOT APPLY "Poly grip" OR ADHESIVES!!!   Do NOT smoke after Midnight   Take these medicines the morning of surgery with A SIP OF WATER:  synthroid, evista, zyrtec allergy   DO NOT TAKE ANY ORAL DIABETIC MEDICATIONS DAY OF YOUR SURGERY  Bring CPAP mask and tubing day of surgery.                              You may not have any metal on your body including hair pins, jewelry, and body piercing             Do not wear make-up, lotions, powders, perfumes/cologne, or deodorant  Do not wear nail polish including gel and S&S, artificial/acrylic nails, or any other type of covering on natural nails including finger and toenails. If you have artificial nails, gel coating, etc. that needs to be removed by a nail salon please have this removed prior to surgery or surgery may need to be canceled/ delayed if the surgeon/ anesthesia feels like they are unable to be safely monitored.   Do not shave  48 hours prior to surgery.               Men may shave face and neck.   Do not bring valuables to the hospital. [place].   Contacts, glasses, dentures or bridgework may not be worn into surgery.   Bring small overnight bag day of surgery.   DO NOT [place]. PHARMACY WILL DISPENSE MEDICATIONS LISTED ON YOUR MEDICATION LIST TO YOU DURING YOUR ADMISSION [place]!    Patients discharged on the day of surgery will not be allowed to drive home.  Someone NEEDS to stay with you for the first 24 hours after anesthesia.   Special Instructions: Bring  a copy of your healthcare power of attorney and living will documents the day of surgery if you haven't scanned them before.              Please read over the following fact sheets you were given: IF [place] [phone]   If you received a COVID test during your pre-op visit  it is requested that you wear a mask when out in public, stay away from anyone that may not be feeling well and notify your surgeon if you develop symptoms. If you test positive for Covid or have been in contact with anyone that has tested positive in the last 10 days please notify you surgeon.    [place] - Preparing for Surgery Before surgery, you can play an important role.  Because skin is not sterile, your skin needs to be as free of germs as possible.  You can reduce the number of germs on your skin by washing with CHG (chlorahexidine gluconate) soap before surgery.  CHG is an antiseptic cleaner which kills germs and bonds with the skin to continue killing germs even after washing. Please DO NOT use if you have an allergy to CHG or antibacterial soaps.  If your skin becomes reddened/irritated stop using the CHG and inform your nurse when you arrive at Short Stay. Do not shave (including legs and underarms) for at least 48 hours prior to the  first CHG shower.  You may shave your face/neck. Please follow these instructions carefully:  1.  Shower with CHG Soap the night before surgery and the  morning of Surgery.  2.  If you choose to wash your hair, wash your hair first as usual with your  normal  shampoo.  3.  After you shampoo, rinse your hair and body thoroughly to remove the  shampoo.                           4.  Use CHG as you would any other liquid soap.  You can apply chg directly  to the skin and wash                       Gently with a scrungie or clean washcloth.  5.  Apply the CHG Soap to your body ONLY FROM THE NECK DOWN.   Do not use on face/ open                            Wound or open sores. Avoid contact with eyes, ears mouth and genitals (private parts).                       Wash face,  Genitals (private parts) with your normal soap.             6.  Wash thoroughly, paying special attention to the area where your surgery  will be performed.  7.  Thoroughly rinse your body with warm water from the neck down.  8.  DO NOT shower/wash with your normal soap after using and rinsing off  the CHG Soap.                9.  Pat yourself dry with a clean towel.            10.  Wear clean pajamas.            11.  Place clean sheets on your bed the night of your first shower and do not  sleep with pets. Day of Surgery : Do not apply any lotions/deodorants the morning of surgery.  Please wear clean clothes to the hospital/surgery center.  FAILURE TO FOLLOW THESE INSTRUCTIONS MAY RESULT IN THE CANCELLATION OF YOUR SURGERY PATIENT SIGNATURE_________________________________  NURSE SIGNATURE__________________________________  ________________________________________________________________________

## 2022-10-19 ENCOUNTER — Encounter (HOSPITAL_COMMUNITY): Payer: Self-pay

## 2022-10-19 ENCOUNTER — Encounter (HOSPITAL_COMMUNITY): Payer: Self-pay | Admitting: Orthopedic Surgery

## 2022-10-19 ENCOUNTER — Encounter (HOSPITAL_COMMUNITY)
Admission: RE | Admit: 2022-10-19 | Discharge: 2022-10-19 | Disposition: A | Discharge status: Home / Self Care | Payer: Medicare PPO | Source: Ambulatory Visit | Attending: Orthopedic Surgery | Admitting: Orthopedic Surgery

## 2022-10-19 ENCOUNTER — Other Ambulatory Visit: Payer: Self-pay

## 2022-10-19 VITALS — BP 143/79 | HR 54 | Temp 98.3°F | Resp 16 | Ht 64.0 in | Wt 154.0 lb

## 2022-10-19 DIAGNOSIS — Z01812 Encounter for preprocedural laboratory examination: Secondary | ICD-10-CM | POA: Insufficient documentation

## 2022-10-19 DIAGNOSIS — Z01818 Encounter for other preprocedural examination: Secondary | ICD-10-CM

## 2022-10-19 DIAGNOSIS — I10 Essential (primary) hypertension: Secondary | ICD-10-CM | POA: Insufficient documentation

## 2022-10-19 DIAGNOSIS — M1711 Unilateral primary osteoarthritis, right knee: Secondary | ICD-10-CM | POA: Insufficient documentation

## 2022-10-19 HISTORY — DX: Unspecified osteoarthritis, unspecified site: M19.90

## 2022-10-19 HISTORY — DX: Hypothyroidism, unspecified: E03.9

## 2022-10-19 HISTORY — DX: Sleep apnea, unspecified: G47.30

## 2022-10-19 LAB — CBC
HCT: 41.4 % (ref 36.0–46.0)
Hemoglobin: 13.6 g/dL (ref 12.0–15.0)
MCH: 31.1 pg (ref 26.0–34.0)
MCHC: 32.9 g/dL (ref 30.0–36.0)
MCV: 94.5 fL (ref 80.0–100.0)
Platelets: 195 10*3/uL (ref 150–400)
RBC: 4.38 MIL/uL (ref 3.87–5.11)
RDW: 12.6 % (ref 11.5–15.5)
WBC: 6.4 10*3/uL (ref 4.0–10.5)
nRBC: 0 % (ref 0.0–0.2)

## 2022-10-19 LAB — BASIC METABOLIC PANEL
Anion gap: 10 (ref 5–15)
BUN: 13 mg/dL (ref 8–23)
CO2: 28 mmol/L (ref 22–32)
Calcium: 10 mg/dL (ref 8.9–10.3)
Chloride: 103 mmol/L (ref 98–111)
Creatinine, Ser: 0.75 mg/dL (ref 0.44–1.00)
GFR, Estimated: >60 mL/min (ref >60)
Glucose, Bld: 88 mg/dL (ref 70–99)
Potassium: 3.1 mmol/L — ABNORMAL LOW (ref 3.5–5.1)
Sodium: 141 mmol/L (ref 135–145)

## 2022-10-19 LAB — SURGICAL PCR SCREEN
MRSA, PCR: NEGATIVE
Staphylococcus aureus: NEGATIVE

## 2022-10-20 NOTE — Anesthesia Preprocedure Evaluation (Addendum)
Anesthesia Evaluation  Patient identified by MRN, date of birth, ID band Patient awake    Reviewed: Allergy & Precautions, H&P , NPO status , Patient's Chart, lab work & pertinent test results  Airway Mallampati: II   Neck ROM: full    Dental   Pulmonary sleep apnea    breath sounds clear to auscultation       Cardiovascular hypertension,  Rhythm:regular Rate:Normal     Neuro/Psych    GI/Hepatic   Endo/Other  Hypothyroidism    Renal/GU      Musculoskeletal  (+) Arthritis ,    Abdominal   Peds  Hematology   Anesthesia Other Findings   Reproductive/Obstetrics                             Anesthesia Physical Anesthesia Plan  ASA: 2  Anesthesia Plan: General   Post-op Pain Management:    Induction: Intravenous  PONV Risk Score and Plan: 3 and Ondansetron, Dexamethasone, Midazolam and Treatment may vary due to age or medical condition  Airway Management Planned: Oral ETT  Additional Equipment:   Intra-op Plan:   Post-operative Plan: Extubation in OR  Informed Consent: I have reviewed the patients History and Physical, chart, labs and discussed the procedure including the risks, benefits and alternatives for the proposed anesthesia with the patient or authorized representative who has indicated his/her understanding and acceptance.     Dental advisory given  Plan Discussed with: CRNA, Anesthesiologist and Surgeon  Anesthesia Plan Comments: (See APP note by Durel Salts, FNP )       Anesthesia Quick Evaluation

## 2022-10-20 NOTE — Progress Notes (Signed)
Anesthesia Chart Review:   Case: [id number] Date/Time: 10/26/22 0911   Procedure: TOTAL KNEE ARTHROPLASTY (Right: Knee)   Anesthesia type: Choice   Pre-op diagnosis: RIGHT knee osteoarthritis   Location: [facility] ROOM 09 / [facility]   Surgeons: Gaynelle Arabian, MD       DISCUSSION: Pt is 77 years old with hx HTN, OSA  Hospitalized 6/29-04/05/22 for syncope and collapse. Fall resulted in fracture of R mandible/ s/p closed reduction and mandibulomaxillary fusion.  Telemetry, EKG unremarkable; Echo showed normal EF and wall motion.  - Pt had outpatient cardiac monitor that did not identify cause of syncope  ED visit 04/17/22 for chest pain, ongoing for a long time, worse with deep breaths or moving L arm. Reproducible with palpation. Troponin x1 negative (2nd not done due to duration of chest pain). CT angio negative for PE  VS: BP (!) 143/79   Pulse (!) 54   Temp 36.8 C (Oral)   Resp 16   Ht 5\' 4"  (1.626 m)   Wt 69.9 kg   SpO2 100%   BMI 26.43 kg/m   PROVIDERS: - PCP is Maury Dus, Brooke Bonito, MD who cleared pt for surgery. Last office viist 04/09/22 with Ferne Reus, PA   LABS: Labs reviewed: Acceptable for surgery. (all labs ordered are listed, but only abnormal results are displayed)  Labs Reviewed  BASIC METABOLIC PANEL - Abnormal; Notable for the following components:      Result Value   Potassium 3.1 (*)    All other components within normal limits  SURGICAL PCR SCREEN  CBC     IMAGES: CT angio chest 04/17/22:  1. No evidence of pulmonary arterial dilatation or embolus. 2. Cardiomegaly with CAD and aortic atherosclerosis. No findings of acute CHF. 3. Both ground-glass and coarse linear atelectatic markings are noted in the lower lobes. This could all be due to atelectasis or could be a combination of atelectasis and pneumonia. 4. Scattered small bronchiolar impactions in both posterior basal lower lobes. Evidence of central bronchitis. 5. Renal cysts.  CXR 04/17/22:  - Mild  bibasilar atelectasis or scarring.    EKG 04/17/22: NSR   CV: Long term cardiac monitor 04/29/22:  - Patient had a min HR of 30 bpm, max HR of 154 bpm, and avg HR of 69 bpm.  - Predominant underlying rhythm was Sinus Rhythm. 2 Supraventricular Tachycardia runs occurred, the run with the fastest interval lasting 10 beats with a max rate of 154 bpm (avg 134 bpm); the run with the fastest interval was also the longest.  - 10 Pauses occurred, the longest lasting 5.2 secs (12 bpm). Isolated SVEs were rare (<1.0%), SVE Triplets were rare (<1.0%), and no SVE Couplets were present. Isolated VEs were rare (<1.0%), VE  Couplets were rare (<1.0%), and no VE Triplets were present.  - Pauses occurred at night  most consistent w hypervagatonia and would consider obstructive sleep apnea-- these findings should not suggest that this is the cause of her syncope   Echo 04/06/22:  1. Left ventricular ejection fraction, by estimation, is 60 to 65%. The left ventricle has normal function. The left ventricle has no regional wall motion abnormalities. Left ventricular diastolic parameters were normal.  2. Right ventricular systolic function is normal. The right ventricular size is normal. There is normal pulmonary artery systolic pressure. The estimated right ventricular systolic pressure is 07.3 mmHg.  3. The mitral valve is grossly normal. Trivial mitral valve regurgitation.  4. The aortic valve is tricuspid. Aortic valve  regurgitation is not visualized. Aortic valve sclerosis is present, with no evidence of aortic valve stenosis.  5. The inferior vena cava is normal in size with <50% respiratory variability, suggesting right atrial pressure of 8 mmHg.    Past Medical History:  Diagnosis Date   Arthritis    Glaucoma    Hypertension    Hypothyroidism    Sleep apnea    not used in 7-8 years per pt   Thyroid disease     Past Surgical History:  Procedure Laterality Date   ABDOMINAL HYSTERECTOMY     bilateral  cataract surgery      03/2022   BREAST EXCISIONAL BIOPSY Left    CLOSED REDUCTION MANDIBLE WITH MANDIBULOMA N/A 04/05/2022   Procedure: CLOSED REDUCTION MANDIBLE WITH MANDIBULOMAXILLARY FUSION;  Surgeon: Gardner Candle, DMD;  Location: [place];  Service: Oral Surgery;  Laterality: N/A;   dental implants      x 3 on 09/29/22   FOOT SURGERY  2019   Arch reconstruction   GLAUCOMA SURGERY     at same time as cataract surgery   SHOULDER SURGERY      MEDICATIONS:  acetaminophen (TYLENOL) 160 MG/5ML liquid   amLODipine (NORVASC) 2.5 MG tablet   Apoaequorin (PREVAGEN) 10 MG CAPS   chlorhexidine (PERIDEX) 0.12 % solution   Cholecalciferol (VITAMIN D3) 50 MCG (2000 UT) capsule   Cod Liver Oil CAPS   Coenzyme Q10 (CO Q 10) 100 MG CAPS   Cyanocobalamin (VITAMIN B12 PO)   diclofenac Sodium (VOLTAREN) 1 % GEL   ezetimibe (ZETIA) 10 MG tablet   feeding supplement (ENSURE ENLIVE / ENSURE PLUS) LIQD   hydrochlorothiazide (HYDRODIURIL) 25 MG tablet   Ibuprofen 200 MG CAPS   levothyroxine (SYNTHROID, LEVOTHROID) 50 MCG tablet   magnesium gluconate (MAGONATE) 500 MG tablet   Multiple Vitamins-Minerals (MULTIVITAMIN WITH MINERALS) tablet   polyethylene glycol (MIRALAX / GLYCOLAX) 17 g packet   potassium chloride (KLOR-CON) 10 MEQ tablet   predniSONE (DELTASONE) 10 MG tablet   Probiotic Product (PROBIOTIC DAILY PO)   raloxifene (EVISTA) 60 MG tablet   rosuvastatin (CRESTOR) 5 MG tablet   triamcinolone cream (KENALOG) 0.1 %   ZYRTEC ALLERGY 10 MG tablet   No current facility-administered medications for this encounter.    If no changes, I anticipate pt can proceed with surgery as scheduled.   Willeen Cass, PhD, FNP-BC [facility] Short Stay Surgical Center/Anesthesiology Phone: [phone] 10/20/2022 12:13 PM

## 2022-10-26 ENCOUNTER — Encounter (HOSPITAL_COMMUNITY)
Admission: RE | Disposition: A | Discharge status: Home / Self Care | Payer: Self-pay | Source: Home / Self Care | Attending: Orthopedic Surgery

## 2022-10-26 ENCOUNTER — Ambulatory Visit (HOSPITAL_COMMUNITY): Payer: Medicare PPO | Admitting: Emergency Medicine

## 2022-10-26 ENCOUNTER — Observation Stay (HOSPITAL_COMMUNITY)
Admission: RE | Admit: 2022-10-26 | Discharge: 2022-10-27 | Disposition: A | Discharge status: Home / Self Care | Payer: Medicare PPO | Attending: Orthopedic Surgery | Admitting: Orthopedic Surgery

## 2022-10-26 ENCOUNTER — Other Ambulatory Visit: Payer: Self-pay

## 2022-10-26 ENCOUNTER — Ambulatory Visit (HOSPITAL_BASED_OUTPATIENT_CLINIC_OR_DEPARTMENT_OTHER): Payer: Medicare PPO | Admitting: Certified Registered Nurse Anesthetist

## 2022-10-26 ENCOUNTER — Encounter (HOSPITAL_COMMUNITY): Payer: Self-pay | Admitting: Orthopedic Surgery

## 2022-10-26 DIAGNOSIS — Z01818 Encounter for other preprocedural examination: Secondary | ICD-10-CM

## 2022-10-26 DIAGNOSIS — E039 Hypothyroidism, unspecified: Secondary | ICD-10-CM | POA: Insufficient documentation

## 2022-10-26 DIAGNOSIS — Z79899 Other long term (current) drug therapy: Secondary | ICD-10-CM | POA: Diagnosis not present

## 2022-10-26 DIAGNOSIS — I10 Essential (primary) hypertension: Secondary | ICD-10-CM | POA: Insufficient documentation

## 2022-10-26 DIAGNOSIS — M1711 Unilateral primary osteoarthritis, right knee: Secondary | ICD-10-CM | POA: Diagnosis not present

## 2022-10-26 DIAGNOSIS — Z91040 Latex allergy status: Secondary | ICD-10-CM | POA: Diagnosis not present

## 2022-10-26 DIAGNOSIS — M179 Osteoarthritis of knee, unspecified: Secondary | ICD-10-CM | POA: Diagnosis present

## 2022-10-26 HISTORY — DX: Unspecified glaucoma: H40.9

## 2022-10-26 HISTORY — PX: TOTAL KNEE ARTHROPLASTY: SHX125

## 2022-10-26 SURGERY — ARTHROPLASTY, KNEE, TOTAL
Anesthesia: General | Site: Knee | Laterality: Right

## 2022-10-26 MED ORDER — PHENOL 1.4 % MT LIQD: 1.0000 | OROMUCOSAL | Status: DC | PRN

## 2022-10-26 MED ORDER — METOCLOPRAMIDE HCL 5 MG/ML IJ SOLN: 5.0000 mg | Freq: Three times a day (TID) | INTRAMUSCULAR | Status: DC | PRN

## 2022-10-26 MED ORDER — KETAMINE HCL 10 MG/ML IJ SOLN
INTRAMUSCULAR | Status: DC | PRN
  Administered 2022-10-26 (×2): 20 mg via INTRAVENOUS

## 2022-10-26 MED ORDER — LACTATED RINGERS IV SOLN: INTRAVENOUS | Status: DC

## 2022-10-26 MED ORDER — HYDROMORPHONE HCL 2 MG PO TABS
1.0000 mg | ORAL_TABLET | ORAL | Status: DC | PRN
  Administered 2022-10-26: 2 mg via ORAL
  Filled 2022-10-26: qty 1

## 2022-10-26 MED ORDER — CEFAZOLIN SODIUM-DEXTROSE 2-4 GM/100ML-% IV SOLN
2.0000 g | Freq: Four times a day (QID) | INTRAVENOUS | Status: AC
  Administered 2022-10-26 (×2): 2 g via INTRAVENOUS
  Filled 2022-10-26 (×2): qty 100

## 2022-10-26 MED ORDER — DEXAMETHASONE SODIUM PHOSPHATE 10 MG/ML IJ SOLN
10.0000 mg | Freq: Once | INTRAMUSCULAR | Status: AC
  Administered 2022-10-27: 10 mg via INTRAVENOUS
  Filled 2022-10-26: qty 1

## 2022-10-26 MED ORDER — HYDROMORPHONE HCL 1 MG/ML IJ SOLN
0.5000 mg | INTRAMUSCULAR | Status: DC | PRN
  Administered 2022-10-26 – 2022-10-27 (×3): 1 mg via INTRAVENOUS
  Filled 2022-10-26 (×3): qty 1

## 2022-10-26 MED ORDER — 0.9 % SODIUM CHLORIDE (POUR BTL) OPTIME
TOPICAL | Status: DC | PRN
  Administered 2022-10-26: 1000 mL

## 2022-10-26 MED ORDER — EPHEDRINE 5 MG/ML INJ
INTRAVENOUS | Status: AC
  Filled 2022-10-26: qty 5

## 2022-10-26 MED ORDER — POVIDONE-IODINE 10 % EX SWAB: 2.0000 | Freq: Once | CUTANEOUS | Status: DC

## 2022-10-26 MED ORDER — PROPOFOL 10 MG/ML IV BOLUS
INTRAVENOUS | Status: DC | PRN
  Administered 2022-10-26: 150 mg via INTRAVENOUS

## 2022-10-26 MED ORDER — ACETAMINOPHEN 500 MG PO TABS
1000.0000 mg | ORAL_TABLET | Freq: Four times a day (QID) | ORAL | Status: DC
  Administered 2022-10-26 – 2022-10-27 (×3): 1000 mg via ORAL
  Filled 2022-10-26 (×4): qty 2

## 2022-10-26 MED ORDER — KETAMINE HCL 50 MG/5ML IJ SOSY
PREFILLED_SYRINGE | INTRAMUSCULAR | Status: AC
  Filled 2022-10-26: qty 5

## 2022-10-26 MED ORDER — ENSURE ENLIVE PO LIQD
237.0000 mL | Freq: Two times a day (BID) | ORAL | Status: DC
  Administered 2022-10-26: 237 mL via ORAL

## 2022-10-26 MED ORDER — FLEET ENEMA 7-19 GM/118ML RE ENEM: 1.0000 | ENEMA | Freq: Once | RECTAL | Status: DC | PRN

## 2022-10-26 MED ORDER — DIPHENHYDRAMINE HCL 12.5 MG/5ML PO ELIX: 12.5000 mg | ORAL_SOLUTION | ORAL | Status: DC | PRN

## 2022-10-26 MED ORDER — ONDANSETRON HCL 4 MG/2ML IJ SOLN
INTRAMUSCULAR | Status: AC
  Filled 2022-10-26: qty 2

## 2022-10-26 MED ORDER — AMLODIPINE BESYLATE 5 MG PO TABS
2.5000 mg | ORAL_TABLET | Freq: Every day | ORAL | Status: DC
  Administered 2022-10-27: 2.5 mg via ORAL
  Filled 2022-10-26: qty 1

## 2022-10-26 MED ORDER — METHOCARBAMOL 500 MG IVPB - SIMPLE MED
500.0000 mg | Freq: Four times a day (QID) | INTRAVENOUS | Status: DC | PRN
  Administered 2022-10-26: 500 mg via INTRAVENOUS
  Filled 2022-10-26: qty 55

## 2022-10-26 MED ORDER — PROPOFOL 10 MG/ML IV BOLUS
INTRAVENOUS | Status: AC
  Filled 2022-10-26: qty 20

## 2022-10-26 MED ORDER — FENTANYL CITRATE PF 50 MCG/ML IJ SOSY
25.0000 ug | PREFILLED_SYRINGE | INTRAMUSCULAR | Status: DC | PRN
  Administered 2022-10-26 (×3): 50 ug via INTRAVENOUS

## 2022-10-26 MED ORDER — STERILE WATER FOR IRRIGATION IR SOLN
Status: DC | PRN
  Administered 2022-10-26: 2000 mL

## 2022-10-26 MED ORDER — CHLORHEXIDINE GLUCONATE 0.12 % MT SOLN
15.0000 mL | Freq: Once | OROMUCOSAL | Status: AC
  Administered 2022-10-26: 15 mL via OROMUCOSAL

## 2022-10-26 MED ORDER — SODIUM CHLORIDE 0.9 % IR SOLN
Status: DC | PRN
  Administered 2022-10-26: 1000 mL

## 2022-10-26 MED ORDER — LIDOCAINE HCL (PF) 2 % IJ SOLN
INTRAMUSCULAR | Status: AC
  Filled 2022-10-26: qty 5

## 2022-10-26 MED ORDER — DEXAMETHASONE SODIUM PHOSPHATE 10 MG/ML IJ SOLN
INTRAMUSCULAR | Status: DC | PRN
  Administered 2022-10-26: 10 mg via INTRAVENOUS

## 2022-10-26 MED ORDER — MIDAZOLAM HCL 2 MG/2ML IJ SOLN
1.0000 mg | INTRAMUSCULAR | Status: DC
  Filled 2022-10-26: qty 2

## 2022-10-26 MED ORDER — TRAMADOL HCL 50 MG PO TABS
50.0000 mg | ORAL_TABLET | Freq: Four times a day (QID) | ORAL | Status: DC | PRN
  Administered 2022-10-26: 50 mg via ORAL
  Filled 2022-10-26: qty 1

## 2022-10-26 MED ORDER — OXYCODONE HCL 5 MG/5ML PO SOLN: 5.0000 mg | Freq: Once | ORAL | Status: DC | PRN

## 2022-10-26 MED ORDER — FENTANYL CITRATE PF 50 MCG/ML IJ SOSY
PREFILLED_SYRINGE | INTRAMUSCULAR | Status: AC
  Filled 2022-10-26: qty 3

## 2022-10-26 MED ORDER — CEFAZOLIN SODIUM-DEXTROSE 2-4 GM/100ML-% IV SOLN
2.0000 g | INTRAVENOUS | Status: AC
  Administered 2022-10-26: 2 g via INTRAVENOUS
  Filled 2022-10-26: qty 100

## 2022-10-26 MED ORDER — HYDROMORPHONE HCL 1 MG/ML IJ SOLN
INTRAMUSCULAR | Status: DC | PRN
  Administered 2022-10-26: 1 mg via INTRAVENOUS

## 2022-10-26 MED ORDER — FENTANYL CITRATE (PF) 100 MCG/2ML IJ SOLN
INTRAMUSCULAR | Status: AC
  Filled 2022-10-26: qty 2

## 2022-10-26 MED ORDER — ROCURONIUM BROMIDE 100 MG/10ML IV SOLN
INTRAVENOUS | Status: DC | PRN
  Administered 2022-10-26: 60 mg via INTRAVENOUS

## 2022-10-26 MED ORDER — ACETAMINOPHEN 10 MG/ML IV SOLN
1000.0000 mg | Freq: Once | INTRAVENOUS | Status: AC
  Administered 2022-10-26: 1000 mg via INTRAVENOUS
  Filled 2022-10-26: qty 100

## 2022-10-26 MED ORDER — HYDROCHLOROTHIAZIDE 25 MG PO TABS
25.0000 mg | ORAL_TABLET | Freq: Every morning | ORAL | Status: DC
  Administered 2022-10-27: 25 mg via ORAL
  Filled 2022-10-26: qty 1

## 2022-10-26 MED ORDER — LORATADINE 10 MG PO TABS
10.0000 mg | ORAL_TABLET | Freq: Every day | ORAL | Status: DC
  Administered 2022-10-27: 10 mg via ORAL
  Filled 2022-10-26: qty 1

## 2022-10-26 MED ORDER — LEVOTHYROXINE SODIUM 50 MCG PO TABS
50.0000 ug | ORAL_TABLET | Freq: Every day | ORAL | Status: DC
  Administered 2022-10-27: 50 ug via ORAL
  Filled 2022-10-26: qty 1

## 2022-10-26 MED ORDER — HYDROMORPHONE HCL 1 MG/ML IJ SOLN
INTRAMUSCULAR | Status: AC
  Filled 2022-10-26: qty 1

## 2022-10-26 MED ORDER — EPHEDRINE SULFATE-NACL 50-0.9 MG/10ML-% IV SOSY
PREFILLED_SYRINGE | INTRAVENOUS | Status: DC | PRN
  Administered 2022-10-26 (×2): 10 mg via INTRAVENOUS

## 2022-10-26 MED ORDER — SUGAMMADEX SODIUM 200 MG/2ML IV SOLN
INTRAVENOUS | Status: DC | PRN
  Administered 2022-10-26: 200 mg via INTRAVENOUS

## 2022-10-26 MED ORDER — DOCUSATE SODIUM 100 MG PO CAPS
100.0000 mg | ORAL_CAPSULE | Freq: Two times a day (BID) | ORAL | Status: DC
  Administered 2022-10-27 (×2): 100 mg via ORAL
  Filled 2022-10-26 (×2): qty 1

## 2022-10-26 MED ORDER — ONDANSETRON HCL 4 MG/2ML IJ SOLN: 4.0000 mg | Freq: Four times a day (QID) | INTRAMUSCULAR | Status: DC | PRN

## 2022-10-26 MED ORDER — HYDROMORPHONE HCL 2 MG/ML IJ SOLN
INTRAMUSCULAR | Status: AC
  Filled 2022-10-26: qty 1

## 2022-10-26 MED ORDER — FENTANYL CITRATE PF 50 MCG/ML IJ SOSY
50.0000 ug | PREFILLED_SYRINGE | INTRAMUSCULAR | Status: DC
  Filled 2022-10-26: qty 2

## 2022-10-26 MED ORDER — TRANEXAMIC ACID-NACL 1000-0.7 MG/100ML-% IV SOLN
1000.0000 mg | INTRAVENOUS | Status: AC
  Administered 2022-10-26: 1000 mg via INTRAVENOUS
  Filled 2022-10-26: qty 100

## 2022-10-26 MED ORDER — ONDANSETRON HCL 4 MG/2ML IJ SOLN
4.0000 mg | Freq: Four times a day (QID) | INTRAMUSCULAR | Status: DC | PRN
  Administered 2022-10-26: 4 mg via INTRAVENOUS
  Filled 2022-10-26 (×2): qty 2

## 2022-10-26 MED ORDER — ONDANSETRON HCL 4 MG PO TABS: 4.0000 mg | ORAL_TABLET | Freq: Four times a day (QID) | ORAL | Status: DC | PRN

## 2022-10-26 MED ORDER — SUCCINYLCHOLINE CHLORIDE 200 MG/10ML IV SOSY
PREFILLED_SYRINGE | INTRAVENOUS | Status: AC
  Filled 2022-10-26: qty 10

## 2022-10-26 MED ORDER — METOCLOPRAMIDE HCL 5 MG PO TABS: 5.0000 mg | ORAL_TABLET | Freq: Three times a day (TID) | ORAL | Status: DC | PRN

## 2022-10-26 MED ORDER — BISACODYL 10 MG RE SUPP: 10.0000 mg | Freq: Every day | RECTAL | Status: DC | PRN

## 2022-10-26 MED ORDER — EZETIMIBE 10 MG PO TABS
10.0000 mg | ORAL_TABLET | Freq: Every day | ORAL | Status: DC
  Administered 2022-10-27: 10 mg via ORAL
  Filled 2022-10-26: qty 1

## 2022-10-26 MED ORDER — RIVAROXABAN 10 MG PO TABS
10.0000 mg | ORAL_TABLET | Freq: Every day | ORAL | Status: DC
  Administered 2022-10-27: 10 mg via ORAL
  Filled 2022-10-26: qty 1

## 2022-10-26 MED ORDER — DEXAMETHASONE SODIUM PHOSPHATE 10 MG/ML IJ SOLN
INTRAMUSCULAR | Status: AC
  Filled 2022-10-26: qty 1

## 2022-10-26 MED ORDER — FENTANYL CITRATE (PF) 100 MCG/2ML IJ SOLN
INTRAMUSCULAR | Status: DC | PRN
  Administered 2022-10-26: 100 ug via INTRAVENOUS

## 2022-10-26 MED ORDER — RALOXIFENE HCL 60 MG PO TABS
60.0000 mg | ORAL_TABLET | Freq: Every morning | ORAL | Status: DC
  Administered 2022-10-27: 60 mg via ORAL
  Filled 2022-10-26: qty 1

## 2022-10-26 MED ORDER — DEXAMETHASONE SODIUM PHOSPHATE 10 MG/ML IJ SOLN: 8.0000 mg | Freq: Once | INTRAMUSCULAR | Status: DC

## 2022-10-26 MED ORDER — ROCURONIUM BROMIDE 10 MG/ML (PF) SYRINGE
PREFILLED_SYRINGE | INTRAVENOUS | Status: AC
  Filled 2022-10-26: qty 10

## 2022-10-26 MED ORDER — OXYCODONE HCL 5 MG PO TABS: 5.0000 mg | ORAL_TABLET | Freq: Once | ORAL | Status: DC | PRN

## 2022-10-26 MED ORDER — MENTHOL 3 MG MT LOZG: 1.0000 | LOZENGE | OROMUCOSAL | Status: DC | PRN

## 2022-10-26 MED ORDER — BUPIVACAINE LIPOSOME 1.3 % IJ SUSP: 20.0000 mL | Freq: Once | INTRAMUSCULAR | Status: DC

## 2022-10-26 MED ORDER — METHOCARBAMOL 500 MG PO TABS
500.0000 mg | ORAL_TABLET | Freq: Four times a day (QID) | ORAL | Status: DC | PRN
  Administered 2022-10-27: 500 mg via ORAL
  Filled 2022-10-26 (×2): qty 1

## 2022-10-26 MED ORDER — SODIUM CHLORIDE 0.9 % IV SOLN: INTRAVENOUS | Status: DC

## 2022-10-26 MED ORDER — POLYETHYLENE GLYCOL 3350 17 G PO PACK: 17.0000 g | PACK | Freq: Every day | ORAL | Status: DC | PRN

## 2022-10-26 MED ORDER — ORAL CARE MOUTH RINSE: 15.0000 mL | Freq: Once | OROMUCOSAL | Status: AC

## 2022-10-26 MED ORDER — HYDROMORPHONE HCL 1 MG/ML IJ SOLN
0.2500 mg | INTRAMUSCULAR | Status: DC | PRN
  Administered 2022-10-26 (×4): 0.5 mg via INTRAVENOUS

## 2022-10-26 MED ORDER — METHOCARBAMOL 500 MG IVPB - SIMPLE MED
INTRAVENOUS | Status: AC
  Filled 2022-10-26: qty 55

## 2022-10-26 MED ORDER — HYDROMORPHONE HCL 2 MG PO TABS
2.0000 mg | ORAL_TABLET | ORAL | Status: DC | PRN
  Administered 2022-10-27: 2 mg via ORAL
  Filled 2022-10-26: qty 1

## 2022-10-26 SURGICAL SUPPLY — 57 items
ATTUNE MED DOME PAT 38 KNEE (Knees) IMPLANT
ATTUNE PS FEM RT SZ 5 CEM KNEE (Femur) IMPLANT
ATTUNE PSRP INSE SZ5 7 KNEE (Insert) IMPLANT
BAG COUNTER SPONGE SURGICOUNT (BAG) IMPLANT
BAG SPEC THK2 15X12 ZIP CLS (MISCELLANEOUS) ×1
BAG SPNG CNTER NS LX DISP (BAG)
BAG ZIPLOCK 12X15 (MISCELLANEOUS) ×2 IMPLANT
BASE TIBIAL ROT PLAT SZ 5 KNEE (Knees) IMPLANT
BLADE SAG 18X100X1.27 (BLADE) ×2 IMPLANT
BLADE SAW SGTL 11.0X1.19X90.0M (BLADE) ×2 IMPLANT
BNDG ELASTIC 4X5.8 VLCR STR LF (GAUZE/BANDAGES/DRESSINGS) IMPLANT
BNDG ELASTIC 6X5.8 VLCR STR LF (GAUZE/BANDAGES/DRESSINGS) ×2 IMPLANT
BOWL SMART MIX CTS (DISPOSABLE) ×2 IMPLANT
BSPLAT TIB 5 CMNT ROT PLAT STR (Knees) ×1 IMPLANT
CEMENT HV SMART SET (Cement) ×4 IMPLANT
COVER SURGICAL LIGHT HANDLE (MISCELLANEOUS) ×2 IMPLANT
CUFF TOURN SGL QUICK 34 (TOURNIQUET CUFF) ×1
CUFF TRNQT CYL 34X4.125X (TOURNIQUET CUFF) ×2 IMPLANT
DRAPE INCISE IOBAN 66X45 STRL (DRAPES) ×2 IMPLANT
DRAPE U-SHAPE 47X51 STRL (DRAPES) ×2 IMPLANT
DRSG AQUACEL AG ADV 3.5X10 (GAUZE/BANDAGES/DRESSINGS) ×2 IMPLANT
DURAPREP 26ML APPLICATOR (WOUND CARE) ×2 IMPLANT
ELECT REM PT RETURN 15FT ADLT (MISCELLANEOUS) ×2 IMPLANT
GLOVE BIO SURGEON STRL SZ 6.5 (GLOVE) IMPLANT
GLOVE BIO SURGEON STRL SZ7.5 (GLOVE) IMPLANT
GLOVE BIO SURGEON STRL SZ8 (GLOVE) ×2 IMPLANT
GLOVE BIOGEL PI IND STRL 6.5 (GLOVE) IMPLANT
GLOVE BIOGEL PI IND STRL 7.0 (GLOVE) IMPLANT
GLOVE BIOGEL PI IND STRL 8 (GLOVE) ×2 IMPLANT
GOWN STRL REUS W/ TWL LRG LVL3 (GOWN DISPOSABLE) ×2 IMPLANT
GOWN STRL REUS W/ TWL XL LVL3 (GOWN DISPOSABLE) IMPLANT
GOWN STRL REUS W/TWL LRG LVL3 (GOWN DISPOSABLE) ×1
GOWN STRL REUS W/TWL XL LVL3 (GOWN DISPOSABLE)
HANDPIECE INTERPULSE COAX TIP (DISPOSABLE) ×1
HOLDER FOLEY CATH W/STRAP (MISCELLANEOUS) IMPLANT
IMMOBILIZER KNEE 20 (SOFTGOODS) ×1
IMMOBILIZER KNEE 20 THIGH 36 (SOFTGOODS) ×2 IMPLANT
KIT TURNOVER KIT A (KITS) IMPLANT
MANIFOLD NEPTUNE II (INSTRUMENTS) ×2 IMPLANT
NS IRRIG 1000ML POUR BTL (IV SOLUTION) ×2 IMPLANT
PACK TOTAL KNEE CUSTOM (KITS) ×2 IMPLANT
PADDING CAST COTTON 6X4 STRL (CAST SUPPLIES) ×4 IMPLANT
PIN STEINMAN FIXATION KNEE (PIN) IMPLANT
PROTECTOR NERVE ULNAR (MISCELLANEOUS) ×2 IMPLANT
SET HNDPC FAN SPRY TIP SCT (DISPOSABLE) ×2 IMPLANT
SPIKE FLUID TRANSFER (MISCELLANEOUS) ×2 IMPLANT
STRIP CLOSURE SKIN 1/2X4 (GAUZE/BANDAGES/DRESSINGS) ×4 IMPLANT
SUT MNCRL AB 4-0 PS2 18 (SUTURE) ×2 IMPLANT
SUT STRATAFIX 0 PDS 27 VIOLET (SUTURE) ×1
SUT VIC AB 2-0 CT1 27 (SUTURE) ×3
SUT VIC AB 2-0 CT1 TAPERPNT 27 (SUTURE) ×6 IMPLANT
SUTURE STRATFX 0 PDS 27 VIOLET (SUTURE) ×2 IMPLANT
TIBIAL BASE ROT PLAT SZ 5 KNEE (Knees) ×1 IMPLANT
TRAY FOLEY MTR SLVR 16FR STAT (SET/KITS/TRAYS/PACK) ×2 IMPLANT
TUBE SUCTION HIGH CAP CLEAR NV (SUCTIONS) ×2 IMPLANT
WATER STERILE IRR 1000ML POUR (IV SOLUTION) ×4 IMPLANT
WRAP KNEE MAXI GEL POST OP (GAUZE/BANDAGES/DRESSINGS) ×2 IMPLANT

## 2022-10-26 NOTE — Op Note (Signed)
OPERATIVE REPORT-TOTAL KNEE ARTHROPLASTY   Pre-operative diagnosis- Osteoarthritis  Right knee(s)  Post-operative diagnosis- Osteoarthritis Right knee(s)  Procedure-  Right  Total Knee Arthroplasty  Surgeon- Gabrielle Plover. Issaiah Seabrooks, MD  Assistant- Shearon Balo, PA-C   Anesthesia-   General  EBL- 25 ml   Drains None  Tourniquet time-  Total Tourniquet Time Documented: Thigh (Right) - 31 minutes Total: Thigh (Right) - 31 minutes     Complications- None  Condition-PACU - hemodynamically stable.   Brief Clinical Note  Gabrielle [name] is a 77 y.o. year old female with end stage OA of her right knee with progressively worsening pain and dysfunction. She has constant pain, with activity and at rest and significant functional deficits with difficulties even with ADLs. She has had extensive non-op management including analgesics, injections of cortisone and viscosupplements, and home exercise program, but remains in significant pain with significant dysfunction.Radiographs show bone on bone arthritis lateral and patellofemoral. She presents now for right Total Knee Arthroplasty.     Procedure in detail---   The patient is brought into the operating room and positioned supine on the operating table. After successful administration of  General,   a tourniquet is placed high on the  Right thigh(s) and the lower extremity is prepped and draped in the usual sterile fashion. Time out is performed by the operating team and then the  Right lower extremity is wrapped in Esmarch, knee flexed and the tourniquet inflated to 300 mmHg.       A midline incision is made with a ten blade through the subcutaneous tissue to the level of the extensor mechanism. A fresh blade is used to make a medial parapatellar arthrotomy. Soft tissue over the proximal medial tibia is subperiosteally elevated to the joint line with a knife and into the semimembranosus bursa with a Cobb elevator. Soft tissue over the proximal  lateral tibia is elevated with attention being paid to avoiding the patellar tendon on the tibial tubercle. The patella is everted, knee flexed 90 degrees and the ACL and PCL are removed. Findings are bone on bone lateral and patellofemoral with large global osteophytes        The drill is used to create a starting hole in the distal femur and the canal is thoroughly irrigated with sterile saline to remove the fatty contents. The 5 degree Right  valgus alignment guide is placed into the femoral canal and the distal femoral cutting block is pinned to remove 9 mm off the distal femur. Resection is made with an oscillating saw.      The tibia is subluxed forward and the menisci are removed. The extramedullary alignment guide is placed referencing proximally at the medial aspect of the tibial tubercle and distally along the second metatarsal axis and tibial crest. The block is pinned to remove 82mm off the more deficient lateral  side. Resection is made with an oscillating saw. Size 5is the most appropriate size for the tibia and the proximal tibia is prepared with the modular drill and keel punch for that size.      The femoral sizing guide is placed and size 5 is most appropriate. Rotation is marked off the epicondylar axis and confirmed by creating a rectangular flexion gap at 90 degrees. The size 5 cutting block is pinned in this rotation and the anterior, posterior and chamfer cuts are made with the oscillating saw. The intercondylar block is then placed and that cut is made.      Trial size  5 tibial component, trial size 5 posterior stabilized femur and a 7  mm posterior stabilized rotating platform insert trial is placed. Full extension is achieved with excellent varus/valgus and anterior/posterior balance throughout full range of motion. The patella is everted and thickness measured to be 22  mm. Free hand resection is taken to 12 mm, a 38 template is placed, lug holes are drilled, trial patella is placed,  and it tracks normally. Osteophytes are removed off the posterior femur with the trial in place. All trials are removed and the cut bone surfaces prepared with pulsatile lavage. Cement is mixed and once ready for implantation, the size 5 tibial implant, size  5 posterior stabilized femoral component, and the size 38 patella are cemented in place and the patella is held with the clamp. The trial insert is placed and the knee held in full extension. The Exparel (20 ml mixed with 60 ml saline) is injected into the extensor mechanism, posterior capsule, medial and lateral gutters and subcutaneous tissues.  All extruded cement is removed and once the cement is hard the permanent 7 mm posterior stabilized rotating platform insert is placed into the tibial tray.      The wound is copiously irrigated with saline solution and the extensor mechanism closed with # 0 Stratofix suture. The tourniquet is released for a total tourniquet time of 31  minutes. Flexion against gravity is 140 degrees and the patella tracks normally. Subcutaneous tissue is closed with 2.0 vicryl and subcuticular with running 4.0 Monocryl. The incision is cleaned and dried and steri-strips and a bulky sterile dressing are applied. The limb is placed into a knee immobilizer and the patient is awakened and transported to recovery in stable condition.      Please note that a surgical assistant was a medical necessity for this procedure in order to perform it in a safe and expeditious manner. Surgical assistant was necessary to retract the ligaments and vital neurovascular structures to prevent injury to them and also necessary for proper positioning of the limb to allow for anatomic placement of the prosthesis.   Gabrielle Plover Jennafer Gladue, MD    10/26/2022, 10:13 AM

## 2022-10-26 NOTE — Transfer of Care (Signed)
Immediate Anesthesia Transfer of Care Note  Patient: Gabrielle [name]  Procedure(s) Performed: TOTAL KNEE ARTHROPLASTY (Right: Knee)  Patient Location: PACU  Anesthesia Type:General  Level of Consciousness: drowsy and patient cooperative  Airway & Oxygen Therapy: Patient Spontanous Breathing and Patient connected to face mask oxygen  Post-op Assessment: Report given to RN and Post -op Vital signs reviewed and stable  Post vital signs: Reviewed and stable  Last Vitals:  Vitals Value Taken Time  BP 167/73 10/26/22 1046  Temp 36.4 C 10/26/22 1046  Pulse 89 10/26/22 1046  Resp 17 10/26/22 1046  SpO2 99 % 10/26/22 1046  Vitals shown include unvalidated device data.  Last Pain:  Vitals:   10/26/22 0732  TempSrc:   PainSc: 0-No pain      Patients Stated Pain Goal: 4 (09/31/12 1624)  Complications: No notable events documented.

## 2022-10-26 NOTE — Evaluation (Signed)
Physical Therapy Evaluation Patient Details Name: Gabrielle [name] MRN: [id number] DOB: 1946-02-29 Today's Date: 10/26/2022  History of Present Illness  Pt is a 77yo female presenting s/p R-TKA on 10/26/22. PMH: HTN, OSA, glaucoma, hypothyroidism.  Clinical Impression  Gabrielle [name] is a 77 y.o. female POD 0 s/p R-TKA. Patient reports independence with mobility at baseline. Patient is now limited by functional impairments (see PT problem list below) and requires supervision for bed mobility and min assist for transfers, further mobility deferred secondary to pain report and anxiety, pt responided well to encouragement and pursed lip breathing. Patient instructed in exercise to facilitate ROM and circulation to manage edema. Provided incentive spirometer and with Vcs pt able to achieve 1231mL. Patient will benefit from continued skilled PT interventions to address impairments and progress towards PLOF. Acute PT will follow to progress mobility and stair training in preparation for safe discharge home.       Recommendations for follow up therapy are one component of a multi-disciplinary discharge planning process, led by the attending physician.  Recommendations may be updated based on patient status, additional functional criteria and insurance authorization.  Follow Up Recommendations Follow physician's recommendations for discharge plan and follow up therapies      Assistance Recommended at Discharge Frequent or constant Supervision/Assistance  Patient can return home with the following  A little help with walking and/or transfers;A little help with bathing/dressing/bathroom;Assistance with cooking/housework;Assist for transportation;Help with stairs or ramp for entrance    Equipment Recommendations None recommended by PT (Pt has recommended DME)  Recommendations for Other Services       Functional Status Assessment Patient has had a recent decline in their functional status and demonstrates  the ability to make significant improvements in function in a reasonable and predictable amount of time.     Precautions / Restrictions Precautions Precautions: Fall Required Braces or Orthoses: Knee Immobilizer - Right Knee Immobilizer - Right: On when out of bed or walking;Discontinue once straight leg raise with < 10 degree lag Restrictions Weight Bearing Restrictions: No Other Position/Activity Restrictions: wbat      Mobility  Bed Mobility Overal bed mobility: Needs Assistance Bed Mobility: Supine to Sit     Supine to sit: Supervision, HOB elevated     General bed mobility comments: For safety only    Transfers Overall transfer level: Needs assistance Equipment used: Rolling walker (2 wheels) Transfers: Sit to/from Stand, Bed to chair/wheelchair/BSC Sit to Stand: Min guard   Step pivot transfers: Min assist       General transfer comment: Sit to stand: min guard for safety only, VCs for hand placement and powering up through LLE and BUE. Step pivot: pt required encouragement, VCs for sequencing, min assist for AD management as pt unable to shift RW during transfer.    Ambulation/Gait               General Gait Details: deferred  Stairs            Wheelchair Mobility    Modified Rankin (Stroke Patients Only)       Balance Overall balance assessment: Needs assistance Sitting-balance support: Feet supported, No upper extremity supported Sitting balance-Leahy Scale: Good     Standing balance support: Reliant on assistive device for balance, During functional activity, Bilateral upper extremity supported Standing balance-Leahy Scale: Poor  Pertinent Vitals/Pain Pain Assessment Pain Assessment: 0-10 Pain Score: 8  Pain Location: right knee Pain Descriptors / Indicators: Operative site guarding Pain Intervention(s): Limited activity within patient's tolerance, Monitored during session, Repositioned,  Ice applied    Home Living Family/patient expects to be discharged to:: Private residence Living Arrangements: Alone Available Help at Discharge: Family;Available 24 hours/day Type of Home: House Home Access: Stairs to enter Entrance Stairs-Rails: None Entrance Stairs-Number of Steps: 1 Alternate Level Stairs-Number of Steps: 14 Home Layout: Two level Home Equipment: Agricultural consultant (2 wheels);BSC/3in1;Cane - single point Additional Comments: Son reports they have it setup so pt can stay downstairs.    Prior Function Prior Level of Function : Independent/Modified Independent             Mobility Comments: IND ADLs Comments: IND     Hand Dominance        Extremity/Trunk Assessment   Upper Extremity Assessment Upper Extremity Assessment: Overall WFL for tasks assessed    Lower Extremity Assessment Lower Extremity Assessment: RLE deficits/detail;LLE deficits/detail RLE Deficits / Details: MMT ank DF/PF 5/5, mild extensor lag noted, SLR very minimal, maybe 1" RLE Sensation: WNL LLE Deficits / Details: MMT ank DF/PF 5/5 LLE Sensation: WNL    Cervical / Trunk Assessment Cervical / Trunk Assessment: Kyphotic  Communication   Communication: No difficulties  Cognition Arousal/Alertness: Awake/alert Behavior During Therapy: WFL for tasks assessed/performed Overall Cognitive Status: Within Functional Limits for tasks assessed                                          General Comments General comments (skin integrity, edema, etc.): Son Gabrielle [name] present    Exercises Total Joint Exercises Ankle Circles/Pumps: AROM, Both, 20 reps   Assessment/Plan    PT Assessment Patient needs continued PT services  PT Problem List Decreased strength;Decreased range of motion;Decreased activity tolerance;Decreased balance;Decreased mobility;Pain       PT Treatment Interventions DME instruction;Gait training;Stair training;Functional mobility training;Therapeutic  activities;Therapeutic exercise;Balance training;Neuromuscular re-education;Patient/family education    PT Goals (Current goals can be found in the Care Plan section)  Acute Rehab PT Goals Patient Stated Goal: to walk better PT Goal Formulation: With patient Time For Goal Achievement: 11/02/22 Potential to Achieve Goals: Good    Frequency 7X/week     Co-evaluation               AM-PAC PT "6 Clicks" Mobility  Outcome Measure Help needed turning from your back to your side while in a flat bed without using bedrails?: None Help needed moving from lying on your back to sitting on the side of a flat bed without using bedrails?: A Little Help needed moving to and from a bed to a chair (including a wheelchair)?: A Little Help needed standing up from a chair using your arms (e.g., wheelchair or bedside chair)?: A Little Help needed to walk in hospital room?: A Little Help needed climbing 3-5 steps with a railing? : A Little 6 Click Score: 19    End of Session Equipment Utilized During Treatment: Gait belt;Right knee immobilizer Activity Tolerance: Patient limited by pain Patient left: in chair;with call bell/phone within reach;with chair alarm set;with family/visitor present;with SCD's reapplied Nurse Communication: Mobility status PT Visit Diagnosis: Pain;Difficulty in walking, not elsewhere classified (R26.2) Pain - Right/Left: Right Pain - part of body: Knee    Time: 8127-5170 PT Time Calculation (min) (ACUTE ONLY):  32 min   Charges:   PT Evaluation $PT Eval Low Complexity: 1 Low PT Treatments $Gait Training: 8-22 mins        Coolidge Breeze, PT, DPT [facility] Department Office: [phone] Weekend pager: [phone]  Coolidge Breeze 10/26/2022, 6:47 PM

## 2022-10-26 NOTE — Discharge Instructions (Addendum)
 Frank Aluisio, MD Total Joint Specialist [facility] [place] [address] [place], [place] [address] [phone]  TOTAL KNEE REPLACEMENT POSTOPERATIVE DIRECTIONS    Knee Rehabilitation, Guidelines Following Surgery  Results after knee surgery are often greatly improved when you follow the exercise, range of motion and muscle strengthening exercises prescribed by your doctor. Safety measures are also important to protect the knee from further injury. If any of these exercises cause you to have increased pain or swelling in your knee joint, decrease the amount until you are comfortable again and slowly increase them. If you have problems or questions, call your caregiver or physical therapist for advice.   BLOOD CLOT PREVENTION Take a 10 mg Xarelto once a day for three weeks following surgery.  You may resume your vitamins/supplements once you have discontinued the Xarelto. Do not take any NSAIDs (Advil, Aleve, Ibuprofen, Meloxicam, etc.) until you have discontinued the Xarelto.   HOME CARE INSTRUCTIONS  Remove items at home which could result in a fall. This includes throw rugs or furniture in walking pathways.  ICE to the affected knee as much as tolerated. Icing helps control swelling. If the swelling is well controlled you will be more comfortable and rehab easier. Continue to use ice on the knee for pain and swelling from surgery. You may notice swelling that will progress down to the foot and ankle. This is normal after surgery. Elevate the leg when you are not up walking on it.    Continue to use the breathing machine which will help keep your temperature down. It is common for your temperature to cycle up and down following surgery, especially at night when you are not up moving around and exerting yourself. The breathing machine keeps your lungs expanded and your temperature down. Do not place pillow under the operative knee, focus on keeping the knee straight  while resting  DIET You may resume your previous home diet once you are discharged from the hospital.  DRESSING / WOUND CARE / SHOWERING Keep your bulky bandage on for 2 days. On the third post-operative day you may remove the Ace bandage and gauze. There is a waterproof adhesive bandage on your skin which will stay in place until your first follow-up appointment. Once you remove this you will not need to place another bandage You may begin showering 3 days following surgery, but do not submerge the incision under water.  ACTIVITY For the first 5 days, the key is rest and control of pain and swelling Do your home exercises twice a day starting on post-operative day 3. On the days you go to physical therapy, just do the home exercises once that day. You should rest, ice and elevate the leg for 50 minutes out of every hour. Get up and walk/stretch for 10 minutes per hour. After 5 days you can increase your activity slowly as tolerated. Walk with your walker as instructed. Use the walker until you are comfortable transitioning to a cane. Walk with the cane in the opposite hand of the operative leg. You may discontinue the cane once you are comfortable and walking steadily. Avoid periods of inactivity such as sitting longer than an hour when not asleep. This helps prevent blood clots.  You may discontinue the knee immobilizer once you are able to perform a straight leg raise while lying down. You may resume a sexual relationship in one month or when given the OK by your doctor.  You may return to work once   you are cleared by your doctor.  Do not drive a car for 6 weeks or until released by your surgeon.  Do not drive while taking narcotics.  TED HOSE STOCKINGS Wear the elastic stockings on both legs for three weeks following surgery during the day. You may remove them at night for sleeping.  WEIGHT BEARING Weight bearing as tolerated with assist device (walker, cane, etc) as directed, use it as  long as suggested by your surgeon or therapist, typically at least 4-6 weeks.  POSTOPERATIVE CONSTIPATION PROTOCOL Constipation - defined medically as fewer than three stools per week and severe constipation as less than one stool per week.  One of the most common issues patients have following surgery is constipation.  Even if you have a regular bowel pattern at home, your normal regimen is likely to be disrupted due to multiple reasons following surgery.  Combination of anesthesia, postoperative narcotics, change in appetite and fluid intake all can affect your bowels.  In order to avoid complications following surgery, here are some recommendations in order to help you during your recovery period.  Colace (docusate) - Pick up an over-the-counter form of Colace or another stool softener and take twice a day as long as you are requiring postoperative pain medications.  Take with a full glass of water daily.  If you experience loose stools or diarrhea, hold the colace until you stool forms back up. If your symptoms do not get better within 1 week or if they get worse, check with your doctor. Dulcolax (bisacodyl) - Pick up over-the-counter and take as directed by the product packaging as needed to assist with the movement of your bowels.  Take with a full glass of water.  Use this product as needed if not relieved by Colace only.  MiraLax (polyethylene glycol) - Pick up over-the-counter to have on hand. MiraLax is a solution that will increase the amount of water in your bowels to assist with bowel movements.  Take as directed and can mix with a glass of water, juice, soda, coffee, or tea. Take if you go more than two days without a movement. Do not use MiraLax more than once per day. Call your doctor if you are still constipated or irregular after using this medication for 7 days in a row.  If you continue to have problems with postoperative constipation, please contact the office for further assistance  and recommendations.  If you experience "the worst abdominal pain ever" or develop nausea or vomiting, please contact the office immediatly for further recommendations for treatment.  ITCHING If you experience itching with your medications, try taking only a single pain pill, or even half a pain pill at a time.  You can also use Benadryl over the counter for itching or also to help with sleep.   MEDICATIONS See your medication summary on the "After Visit Summary" that the nursing staff will review with you prior to discharge.  You may have some home medications which will be placed on hold until you complete the course of blood thinner medication.  It is important for you to complete the blood thinner medication as prescribed by your surgeon.  Continue your approved medications as instructed at time of discharge.  PRECAUTIONS If you experience chest pain or shortness of breath - call 911 immediately for transfer to the hospital emergency department.  If you develop a fever greater that 101 F, purulent drainage from wound, increased redness or drainage from wound, foul odor from the wound/dressing,   or calf pain - CONTACT YOUR SURGEON.                                                   FOLLOW-UP APPOINTMENTS Make sure you keep all of your appointments after your operation with your surgeon and caregivers. You should call the office at the above phone number and make an appointment for approximately two weeks after the date of your surgery or on the date instructed by your surgeon outlined in the "After Visit Summary".  RANGE OF MOTION AND STRENGTHENING EXERCISES  Rehabilitation of the knee is important following a knee injury or an operation. After just a few days of immobilization, the muscles of the thigh which control the knee become weakened and shrink (atrophy). Knee exercises are designed to build up the tone and strength of the thigh muscles and to improve knee motion. Often times heat used for  twenty to thirty minutes before working out will loosen up your tissues and help with improving the range of motion but do not use heat for the first two weeks following surgery. These exercises can be done on a training (exercise) mat, on the floor, on a table or on a bed. Use what ever works the best and is most comfortable for you Knee exercises include:  Leg Lifts - While your knee is still immobilized in a splint or cast, you can do straight leg raises. Lift the leg to 60 degrees, hold for 3 sec, and slowly lower the leg. Repeat 10-20 times 2-3 times daily. Perform this exercise against resistance later as your knee gets better.  Quad and Hamstring Sets - Tighten up the muscle on the front of the thigh (Quad) and hold for 5-10 sec. Repeat this 10-20 times hourly. Hamstring sets are done by pushing the foot backward against an object and holding for 5-10 sec. Repeat as with quad sets.  Leg Slides: Lying on your back, slowly slide your foot toward your buttocks, bending your knee up off the floor (only go as far as is comfortable). Then slowly slide your foot back down until your leg is flat on the floor again. Angel Wings: Lying on your back spread your legs to the side as far apart as you can without causing discomfort.  A rehabilitation program following serious knee injuries can speed recovery and prevent re-injury in the future due to weakened muscles. Contact your doctor or a physical therapist for more information on knee rehabilitation.   POST-OPERATIVE OPIOID TAPER INSTRUCTIONS: It is important to wean off of your opioid medication as soon as possible. If you do not need pain medication after your surgery it is ok to stop day one. Opioids include: Codeine, Hydrocodone(Norco, Vicodin), Oxycodone(Percocet, oxycontin) and hydromorphone amongst others.  Long term and even short term use of opiods can cause: Increased pain response Dependence Constipation Depression Respiratory depression And  more.  Withdrawal symptoms can include Flu like symptoms Nausea, vomiting And more Techniques to manage these symptoms Hydrate well Eat regular healthy meals Stay active Use relaxation techniques(deep breathing, meditating, yoga) Do Not substitute Alcohol to help with tapering If you have been on opioids for less than two weeks and do not have pain than it is ok to stop all together.  Plan to wean off of opioids This plan should start within one week post op of   your joint replacement. Maintain the same interval or time between taking each dose and first decrease the dose.  Cut the total daily intake of opioids by one tablet each day Next start to increase the time between doses. The last dose that should be eliminated is the evening dose.   IF YOU ARE TRANSFERRED TO A SKILLED REHAB FACILITY If the patient is transferred to a skilled rehab facility following release from the hospital, a list of the current medications will be sent to the facility for the patient to continue.  When discharged from the skilled rehab facility, please have the facility set up the patient's Home Health Physical Therapy prior to being released. Also, the skilled facility will be responsible for providing the patient with their medications at time of release from the facility to include their pain medication, the muscle relaxants, and their blood thinner medication. If the patient is still at the rehab facility at time of the two week follow up appointment, the skilled rehab facility will also need to assist the patient in arranging follow up appointment in our office and any transportation needs.  MAKE SURE YOU:  Understand these instructions.  Get help right away if you are not doing well or get worse.   DENTAL ANTIBIOTICS:  In most cases prophylactic antibiotics for Dental procdeures after total joint surgery are not necessary.  Exceptions are as follows:  1. History of prior total joint infection  2.  Severely immunocompromised (Organ Transplant, cancer chemotherapy, Rheumatoid biologic meds such as Humera)  3. Poorly controlled diabetes (A1C &gt; 8.0, blood glucose over 200)  If you have one of these conditions, contact your surgeon for an antibiotic prescription, prior to your dental procedure.    Pick up stool softner and laxative for home use following surgery while on pain medications. Do not submerge incision under water. Please use good hand washing techniques while changing dressing each day. May shower starting three days after surgery. Please use a clean towel to pat the incision dry following showers. Continue to use ice for pain and swelling after surgery. Do not use any lotions or creams on the incision until instructed by your surgeon.   +++++++++++++++++++++++++++++++++++++++++++++++++++++++++++++++++++++++++++++  Information on my medicine - XARELTO (Rivaroxaban)  Why was Xarelto prescribed for you? Xarelto was prescribed for you to reduce the risk of blood clots forming after orthopedic surgery. The medical term for these abnormal blood clots is venous thromboembolism (VTE).  What do you need to know about xarelto ? Take your Xarelto ONCE DAILY at the same time every day. You may take it either with or without food.  If you have difficulty swallowing the tablet whole, you may crush it and mix in applesauce just prior to taking your dose.  Take Xarelto exactly as prescribed by your doctor and DO NOT stop taking Xarelto without talking to the doctor who prescribed the medication.  Stopping without other VTE prevention medication to take the place of Xarelto may increase your risk of developing a clot.  After discharge, you should have regular check-up appointments with your healthcare provider that is prescribing your Xarelto.    What do you do if you miss a dose? If you miss a dose, take it as soon as you remember on the same day then continue your  regularly scheduled once daily regimen the next day. Do not take two doses of Xarelto on the same day.   Important Safety Information A possible side effect of Xarelto is bleeding. You   should call your healthcare provider right away if you experience any of the following: Bleeding from an injury or your nose that does not stop. Unusual colored urine (red or dark brown) or unusual colored stools (red or black). Unusual bruising for unknown reasons. A serious fall or if you hit your head (even if there is no bleeding).  Some medicines may interact with Xarelto and might increase your risk of bleeding while on Xarelto. To help avoid this, consult your healthcare provider or pharmacist prior to using any new prescription or non-prescription medications, including herbals, vitamins, non-steroidal anti-inflammatory drugs (NSAIDs) and supplements.  This website has more information on Xarelto: www.xarelto.com.   

## 2022-10-26 NOTE — Care Plan (Signed)
Ortho [facility] Case Management Note  Patient Details  Name: Gabrielle [name] MRN: [id number] Date of Birth: 1945/11/12                  R TKA on 10/26/22. DCP: Home with son. DME: No needs. Has RW. PT: EO 1/26   DME Arranged:  N/A DME Agency:       Additional Comments: Please contact me with any questions of if this plan should need to change.   Dario Ave, Case Manager  [facility]  [phone] 10/26/2022, 2:38 PM

## 2022-10-26 NOTE — Plan of Care (Signed)
  Problem: Activity: Goal: Ability to avoid complications of mobility impairment will improve Outcome: Progressing   Problem: Pain Management: Goal: Pain level will decrease with appropriate interventions Outcome: Progressing   Problem: Nutrition: Goal: Adequate nutrition will be maintained Outcome: Progressing   

## 2022-10-26 NOTE — Interval H&P Note (Signed)
History and Physical Interval Note:  10/26/2022 8:22 AM  Gabrielle [name]  has presented today for surgery, with the diagnosis of RIGHT knee osteoarthritis.  The various methods of treatment have been discussed with the patient and family. After consideration of risks, benefits and other options for treatment, the patient has consented to  Procedure(s): TOTAL KNEE ARTHROPLASTY (Right) as a surgical intervention.  The patient's history has been reviewed, patient examined, no change in status, stable for surgery.  I have reviewed the patient's chart and labs.  Questions were answered to the patient's satisfaction.     Pilar Plate Leona Pressly

## 2022-10-27 DIAGNOSIS — Z79899 Other long term (current) drug therapy: Secondary | ICD-10-CM | POA: Diagnosis not present

## 2022-10-27 DIAGNOSIS — E039 Hypothyroidism, unspecified: Secondary | ICD-10-CM | POA: Diagnosis not present

## 2022-10-27 DIAGNOSIS — M1711 Unilateral primary osteoarthritis, right knee: Secondary | ICD-10-CM | POA: Diagnosis not present

## 2022-10-27 DIAGNOSIS — Z91040 Latex allergy status: Secondary | ICD-10-CM | POA: Diagnosis not present

## 2022-10-27 DIAGNOSIS — I10 Essential (primary) hypertension: Secondary | ICD-10-CM | POA: Diagnosis not present

## 2022-10-27 LAB — BASIC METABOLIC PANEL
Anion gap: 8 (ref 5–15)
BUN: 9 mg/dL (ref 8–23)
CO2: 22 mmol/L (ref 22–32)
Calcium: 9.8 mg/dL (ref 8.9–10.3)
Chloride: 105 mmol/L (ref 98–111)
Creatinine, Ser: 0.68 mg/dL (ref 0.44–1.00)
GFR, Estimated: >60 mL/min (ref >60)
Glucose, Bld: 143 mg/dL — ABNORMAL HIGH (ref 70–99)
Potassium: 3.8 mmol/L (ref 3.5–5.1)
Sodium: 135 mmol/L (ref 135–145)

## 2022-10-27 LAB — CBC
HCT: 35.6 % — ABNORMAL LOW (ref 36.0–46.0)
Hemoglobin: 12.2 g/dL (ref 12.0–15.0)
MCH: 32.3 pg (ref 26.0–34.0)
MCHC: 34.3 g/dL (ref 30.0–36.0)
MCV: 94.2 fL (ref 80.0–100.0)
Platelets: 152 10*3/uL (ref 150–400)
RBC: 3.78 MIL/uL — ABNORMAL LOW (ref 3.87–5.11)
RDW: 12.9 % (ref 11.5–15.5)
WBC: 12.4 10*3/uL — ABNORMAL HIGH (ref 4.0–10.5)
nRBC: 0 % (ref 0.0–0.2)

## 2022-10-27 MED ORDER — METHOCARBAMOL 500 MG PO TABS: 500.0000 mg | ORAL_TABLET | Freq: Four times a day (QID) | ORAL | 0 refills | Status: DC | PRN

## 2022-10-27 MED ORDER — TRAMADOL HCL 50 MG PO TABS: 50.0000 mg | ORAL_TABLET | Freq: Four times a day (QID) | ORAL | 0 refills | Status: DC | PRN

## 2022-10-27 MED ORDER — HYDROMORPHONE HCL 2 MG PO TABS: 2.0000 mg | ORAL_TABLET | Freq: Four times a day (QID) | ORAL | 0 refills | Status: DC | PRN

## 2022-10-27 MED ORDER — RIVAROXABAN 10 MG PO TABS: 10.0000 mg | ORAL_TABLET | Freq: Every day | ORAL | 0 refills | Status: AC

## 2022-10-27 NOTE — Discharge Summary (Signed)
Physician Discharge Summary   Patient ID: Gabrielle [name] MRN: [id number] DOB/AGE: 75-Sep-1947 75 y.o.  Admit date: 10/26/2022 Discharge date: 10/27/2022  Primary Diagnosis: Osteoarthritis, right knee   Admission Diagnoses:  Past Medical History:  Diagnosis Date   Arthritis    Glaucoma    Hypertension    Hypothyroidism    Sleep apnea    not used in 7-8 years per pt   Thyroid disease    Discharge Diagnoses:   Principal Problem:   OA (osteoarthritis) of knee Active Problems:   Primary osteoarthritis of right knee  Estimated body mass index is 26.43 kg/m as calculated from the following:   Height as of this encounter: 5\' 4"  (1.626 m).   Weight as of this encounter: 69.9 kg.  Procedure:  Procedure(s) (LRB): TOTAL KNEE ARTHROPLASTY (Right)   Consults: None  HPI: Gabrielle [name] is a 77 y.o. year old female with end stage OA of her right knee with progressively worsening pain and dysfunction. She has constant pain, with activity and at rest and significant functional deficits with difficulties even with ADLs. She has had extensive non-op management including analgesics, injections of cortisone and viscosupplements, and home exercise program, but remains in significant pain with significant dysfunction.Radiographs show bone on bone arthritis lateral and patellofemoral. She presents now for right Total Knee Arthroplasty.  Laboratory Data: Admission on 10/26/2022, Discharged on 10/27/2022  Component Date Value Ref Range Status   WBC 10/27/2022 12.4 (H)  4.0 - 10.5 K/uL Final   RBC 10/27/2022 3.78 (L)  3.87 - 5.11 MIL/uL Final   Hemoglobin 10/27/2022 12.2  12.0 - 15.0 g/dL Final   HCT 10/27/2022 35.6 (L)  36.0 - 46.0 % Final   MCV 10/27/2022 94.2  80.0 - 100.0 fL Final   MCH 10/27/2022 32.3  26.0 - 34.0 pg Final   MCHC 10/27/2022 34.3  30.0 - 36.0 g/dL Final   RDW 10/27/2022 12.9  11.5 - 15.5 % Final   Platelets 10/27/2022 152  150 - 400 K/uL Final   nRBC 10/27/2022 0.0  0.0 -  0.2 % Final   Performed at [facility], [place] [address], [place], [place] [address]   Sodium 10/27/2022 135  135 - 145 mmol/L Final   Potassium 10/27/2022 3.8  3.5 - 5.1 mmol/L Final   Chloride 10/27/2022 105  98 - 111 mmol/L Final   CO2 10/27/2022 22  22 - 32 mmol/L Final   Glucose, Bld 10/27/2022 143 (H)  70 - 99 mg/dL Final   Glucose reference range applies only to samples taken after fasting for at least 8 hours.   BUN 10/27/2022 9  8 - 23 mg/dL Final   Creatinine, Ser 10/27/2022 0.68  0.44 - 1.00 mg/dL Final   Calcium 10/27/2022 9.8  8.9 - 10.3 mg/dL Final   GFR, Estimated 10/27/2022 >60  >60 mL/min Final   Comment: (NOTE) Calculated using the CKD-EPI Creatinine Equation (2021)    Anion gap 10/27/2022 8  5 - 15 Final   Performed at [facility], [place] [address] [place], [place] [address]  Hospital Outpatient Visit on 10/19/2022  Component Date Value Ref Range Status   MRSA, PCR 10/19/2022 NEGATIVE  NEGATIVE Final   Staphylococcus aureus 10/19/2022 NEGATIVE  NEGATIVE Final   Comment: (NOTE) The Xpert SA Assay (FDA approved for NASAL specimens in patients 8 years of age and older), is one component of a comprehensive surveillance program. It is not intended to diagnose infection nor to guide  or monitor treatment. Performed at [facility], [address], [place], [place] [address]    WBC 10/19/2022 6.4  4.0 - 10.5 K/uL Final   RBC 10/19/2022 4.38  3.87 - 5.11 MIL/uL Final   Hemoglobin 10/19/2022 13.6  12.0 - 15.0 g/dL Final   HCT 87/68/1157 41.4  36.0 - 46.0 % Final   MCV 10/19/2022 94.5  80.0 - 100.0 fL Final   MCH 10/19/2022 31.1  26.0 - 34.0 pg Final   MCHC 10/19/2022 32.9  30.0 - 36.0 g/dL Final   RDW 26/20/3559 12.6  11.5 - 15.5 % Final   Platelets 10/19/2022 195  150 - 400 K/uL Final   nRBC 10/19/2022 0.0  0.0 - 0.2 % Final   Performed at [facility], [address] [place], [place]  [address]   Sodium 10/19/2022 141  135 - 145 mmol/L Final   Potassium 10/19/2022 3.1 (L)  3.5 - 5.1 mmol/L Final   Chloride 10/19/2022 103  98 - 111 mmol/L Final   CO2 10/19/2022 28  22 - 32 mmol/L Final   Glucose, Bld 10/19/2022 88  70 - 99 mg/dL Final   Glucose reference range applies only to samples taken after fasting for at least 8 hours.   BUN 10/19/2022 13  8 - 23 mg/dL Final   Creatinine, Ser 10/19/2022 0.75  0.44 - 1.00 mg/dL Final   Calcium 84/53/6468 10.0  8.9 - 10.3 mg/dL Final   GFR, Estimated 10/19/2022 >60  >60 mL/min Final   Comment: (NOTE) Calculated using the CKD-EPI Creatinine Equation (2021)    Anion gap 10/19/2022 10  5 - 15 Final   Performed at [facility], [address], [place], [place] [address]     X-Rays:No results found.  EKG: Orders placed or performed during the hospital encounter of 04/17/22   EKG 12-Lead   EKG 12-Lead   ED EKG   ED EKG   EKG     Hospital Course: Gabrielle [name] is a 77 y.o. who was admitted to [facility]. They were brought to the operating room on 10/26/2022 and underwent Procedure(s): TOTAL KNEE ARTHROPLASTY.  Patient tolerated the procedure well and was later transferred to the recovery room and then to the orthopaedic floor for postoperative care. They were given PO and IV analgesics for pain control following their surgery. They were given 24 hours of postoperative antibiotics of  Anti-infectives (From admission, onward)    Start     Dose/Rate Route Frequency Ordered Stop   10/26/22 1600  ceFAZolin (ANCEF) IVPB 2g/100 mL premix        2 g 200 mL/hr over 30 Minutes Intravenous Every 6 hours 10/26/22 1346 10/27/22 0000   10/26/22 0730  ceFAZolin (ANCEF) IVPB 2g/100 mL premix        2 g 200 mL/hr over 30 Minutes Intravenous On call to O.R. 10/26/22 0715 10/26/22 0916      and started on DVT prophylaxis in the form of Xarelto.   PT and OT were ordered for total joint protocol. Discharge planning  consulted to help with postop disposition and equipment needs.  Patient had a fair night on the evening of surgery. They started to get up OOB with therapy on POD #0. Pt was seen during rounds and was ready to go home pending progress with therapy. She worked with therapy on POD #1 and was meeting her goals. Pt was discharged to home later that day in stable condition.  Diet: Regular diet Activity: WBAT Follow-up: in 2 weeks Disposition: Home Discharged Condition: stable   Discharge Instructions     Call MD / Call 911   Complete by: As directed    If you experience chest pain or shortness of breath, CALL 911 and be transported to the hospital emergency room.  If you develope a fever above 101 F, pus (white drainage) or increased drainage or redness at the wound, or calf pain, call your surgeon's office.   Change dressing   Complete by: As directed    You may remove the bulky bandage (ACE wrap and gauze) two days after surgery. You will have an adhesive waterproof bandage underneath. Leave this in place until your first follow-up appointment.   Constipation Prevention   Complete by: As directed    Drink plenty of fluids.  Prune juice may be helpful.  You may use a stool softener, such as Colace (over the counter) 100 mg twice a day.  Use MiraLax (over the counter) for constipation as needed.   Diet - low sodium heart healthy   Complete by: As directed    Do not put a pillow under the knee. Place it under the heel.   Complete by: As directed    Driving restrictions   Complete by: As directed    No driving for two weeks   Post-operative opioid taper instructions:   Complete by: As directed    POST-OPERATIVE OPIOID TAPER INSTRUCTIONS: It is important to wean off of your opioid medication as soon as possible. If you do not need pain medication after your surgery it is ok to stop day one. Opioids include: Codeine, Hydrocodone(Norco, Vicodin), Oxycodone(Percocet, oxycontin) and hydromorphone  amongst others.  Long term and even short term use of opiods can cause: Increased pain response Dependence Constipation Depression Respiratory depression And more.  Withdrawal symptoms can include Flu like symptoms Nausea, vomiting And more Techniques to manage these symptoms Hydrate well Eat regular healthy meals Stay active Use relaxation techniques(deep breathing, meditating, yoga) Do Not substitute Alcohol to help with tapering If you have been on opioids for less than two weeks and do not have pain than it is ok to stop all together.  Plan to wean off of opioids This plan should start within one week post op of your joint replacement. Maintain the same interval or time between taking each dose and first decrease the dose.  Cut the total daily intake of opioids by one tablet each day Next start to increase the time between doses. The last dose that should be eliminated is the evening dose.      TED hose   Complete by: As directed    Use stockings (TED hose) for three weeks on both leg(s).  You may remove them at night for sleeping.   Weight bearing as tolerated   Complete by: As directed       Allergies as of 10/27/2022       Reactions   Lidocaine Swelling, Other (See Comments)   Twitching, facial swelling, and shaking    Aspirin Other (See Comments)   Increases heart rate   Hylan G-f 20 Swelling, Other (See Comments)   Decreased facial feeling and chills- also   Lisinopril Other (See Comments), Cough   Dry cough   Losartan Other (See Comments)   Dizziness/headaches   Metoprolol Other (See Comments)   Dizziness   Olmesartan Other (See Comments)   Hair loss   Codeine Hives, Rash, Other (See Comments)  Hallucinations and syncope, also   Latex Rash   Sulfa Antibiotics Hives, Rash        Medication List     STOP taking these medications    Co Q 10 100 MG Caps   Cod Liver Oil Caps   Ibuprofen 200 MG Caps   multivitamin with minerals tablet    Prevagen 10 MG Caps Generic drug: Apoaequorin   PROBIOTIC DAILY PO   VITAMIN B12 PO   Vitamin D3 50 MCG (2000 UT) capsule       TAKE these medications    acetaminophen 160 MG/5ML liquid Commonly known as: TYLENOL Take 15.6 mLs (500 mg total) by mouth every 4 (four) hours as needed for fever (mild pain).   amLODipine 2.5 MG tablet Commonly known as: NORVASC Take 2.5 mg by mouth at bedtime.   chlorhexidine 0.12 % solution Commonly known as: PERIDEX Use as directed 15 mLs in the mouth or throat 2 (two) times daily.   ezetimibe 10 MG tablet Commonly known as: ZETIA Take 10 mg by mouth daily.   feeding supplement Liqd Take 237 mLs by mouth 2 (two) times daily between meals. What changed: additional instructions   hydrochlorothiazide 25 MG tablet Commonly known as: HYDRODIURIL Take 25 mg by mouth every morning.   HYDROmorphone 2 MG tablet Commonly known as: DILAUDID Take 1-2 tablets (2-4 mg total) by mouth every 6 (six) hours as needed for severe pain.   levothyroxine 50 MCG tablet Commonly known as: SYNTHROID Take 50 mcg by mouth daily before breakfast.   magnesium gluconate 500 MG tablet Commonly known as: MAGONATE Take 500 mg by mouth daily.   methocarbamol 500 MG tablet Commonly known as: ROBAXIN Take 1 tablet (500 mg total) by mouth every 6 (six) hours as needed for muscle spasms.   polyethylene glycol 17 g packet Commonly known as: MIRALAX / GLYCOLAX Take 17 g by mouth daily as needed for mild constipation.   potassium chloride 10 MEQ tablet Commonly known as: KLOR-CON Take 10 mEq by mouth every morning.   predniSONE 10 MG tablet Commonly known as: DELTASONE Take 10 mg by mouth daily with breakfast. Taper   raloxifene 60 MG tablet Commonly known as: EVISTA Take 60 mg by mouth every morning.   rivaroxaban 10 MG Tabs tablet Commonly known as: XARELTO Take 1 tablet (10 mg total) by mouth daily with breakfast for 20 days. Then discontinue.    rosuvastatin 5 MG tablet Commonly known as: CRESTOR Take 5 mg by mouth 3 (three) times a week.   traMADol 50 MG tablet Commonly known as: ULTRAM Take 1-2 tablets (50-100 mg total) by mouth every 6 (six) hours as needed for moderate pain.   triamcinolone cream 0.1 % Commonly known as: KENALOG Apply 1 Application topically 2 (two) times daily.   Voltaren 1 % Gel Generic drug: diclofenac Sodium Apply 2 g topically 4 (four) times daily as needed (for pain- shoulders, hands, and knees).   ZyrTEC Allergy 10 MG tablet Generic drug: cetirizine Take 10 mg by mouth daily.               Discharge Care Instructions  (From admission, onward)           Start     Ordered   10/27/22 0000  Weight bearing as tolerated        10/27/22 0714   10/27/22 0000  Change dressing       Comments: You may remove the bulky bandage (ACE wrap and gauze)  two days after surgery. You will have an adhesive waterproof bandage underneath. Leave this in place until your first follow-up appointment.   10/27/22 7494            Follow-up Information     Gaynelle Arabian, MD. Go on 11/10/2022.   Specialty: Orthopedic Surgery Why: You are scheduled for first post op appt on Tuesday February 6 at 2:00pm. Contact information: [address] [place] [address] [phone]                 Signed: R. Jaynie Bream, PA-C Orthopedic Surgery 10/27/2022, 8:34 PM

## 2022-10-27 NOTE — TOC Transition Note (Signed)
Transition of Care [facility]) - CM/SW Discharge Note   Patient Details  Name: Gabrielle [name] MRN: [id number] Date of Birth: [birth year]  [facility] CM/SW Contact:  Lennart Pall, LCSW Phone Number: 10/27/2022, 11:31 AM   Clinical Narrative:    Met with pt and confirming she has needed DME at home.  OPPT already arranged with [facility].  No TOC needs.   Final next level of care: OP Rehab Barriers to Discharge: No Barriers Identified   Patient Goals and CMS Choice      Discharge Placement                         Discharge Plan and Services Additional resources added to the After Visit Summary for                  DME Arranged: N/A                    Social Determinants of Health (SDOH) Interventions SDOH Screenings   Food Insecurity: No Food Insecurity (10/26/2022)  Housing: Low Risk  (10/26/2022)  Transportation Needs: No Transportation Needs (10/26/2022)  Utilities: Not At Risk (10/26/2022)  Tobacco Use: Low Risk  (10/26/2022)     Readmission Risk Interventions     No data to display

## 2022-10-27 NOTE — Progress Notes (Signed)
Physical Therapy Treatment Patient Details Name: Gabrielle [name] MRN: [id number] DOB: Apr 19, 1946 Today's Date: 10/27/2022   History of Present Illness Pt is a 77yo female presenting s/p R-TKA on 10/26/22. PMH: HTN, OSA, glaucoma, hypothyroidism.    PT Comments    Pt seen for PT tx with pt agreeable, son arriving shortly after start of session. Pt is able to complete bed mobility with supervision & hospital bed features, STS with CGA<>Min assist with cuing for hand placement. Pt progresses to ambulating to bathroom, in room & in hallway with RW & min assist with impaired gait pattern as noted below. Focused on stair negotiation with pt requiring min assist with RW to negotiate single step. Pt with c/o nausea at end of session. Pt would benefit from PT session for further stair & gait training & pt agreeable. Educated pt & son on need to wear R KI until pt can perform SLR without assistance (pt unable to lift RLE off bed when attempting SLR today).     Recommendations for follow up therapy are one component of a multi-disciplinary discharge planning process, led by the attending physician.  Recommendations may be updated based on patient status, additional functional criteria and insurance authorization.  Follow Up Recommendations  Follow physician's recommendations for discharge plan and follow up therapies (recommend HHPT)     Assistance Recommended at Discharge Frequent or constant Supervision/Assistance  Patient can return home with the following A little help with walking and/or transfers;A little help with bathing/dressing/bathroom;Assistance with cooking/housework;Assist for transportation;Help with stairs or ramp for entrance   Equipment Recommendations  None recommended by PT    Recommendations for Other Services       Precautions / Restrictions Precautions Precautions: Fall Required Braces or Orthoses: Knee Immobilizer - Right Knee Immobilizer - Right: On when out of bed or  walking;Discontinue once straight leg raise with < 10 degree lag Restrictions Weight Bearing Restrictions: Yes RLE Weight Bearing: Weight bearing as tolerated     Mobility  Bed Mobility Overal bed mobility: Needs Assistance Bed Mobility: Supine to Sit     Supine to sit: Supervision, HOB elevated     General bed mobility comments: pt uses BUE to assist RLE to EOB, use of bed rails, HOB elevated    Transfers Overall transfer level: Needs assistance Equipment used: Rolling walker (2 wheels) Transfers: Sit to/from Stand Sit to Stand: Min assist, Min guard           General transfer comment: STS from recliner, elevated EOB, low toilet, with CGA except min assist from low toilet with cuing to use grab bar    Ambulation/Gait Ambulation/Gait assistance: Min guard Gait Distance (Feet):  (12 + 12 ft + 30 ft + 50 ft) Assistive device: Rolling walker (2 wheels) Gait Pattern/deviations: Step-to pattern, Decreased step length - right, Decreased step length - left, Decreased stride length, Decreased dorsiflexion - right, Decreased dorsiflexion - left Gait velocity: decreased     General Gait Details: Pt requries cuing to ambulate within base of AD vs pushing it out in front of him, pt presents with decreased BLE step length, decreased heel strike BLE   Stairs Stairs: Yes Stairs assistance: Min assist Stair Management: No rails, With walker, Forwards, Backwards Number of Stairs: 1 General stair comments: Pt negotiates 1 step with RW multiple times with min assist, forwards & backwards with pt reporting more comfort with ascending step backwards. Pt requires extra time & min cuing to recall compensatory sequencing.   Wheelchair Mobility  Modified Rankin (Stroke Patients Only)       Balance Overall balance assessment: Needs assistance Sitting-balance support: Feet supported, No upper extremity supported Sitting balance-Leahy Scale: Good     Standing balance support:  Reliant on assistive device for balance, During functional activity, Bilateral upper extremity supported Standing balance-Leahy Scale: Fair                              Cognition Arousal/Alertness: Awake/alert Behavior During Therapy: WFL for tasks assessed/performed Overall Cognitive Status: Within Functional Limits for tasks assessed                                          Exercises      General Comments General comments (skin integrity, edema, etc.): Pt with continent void on toilet, performs peri hygiene without assistance. Son Gabrielle [name] present for majority of session.      Pertinent Vitals/Pain Pain Assessment Pain Assessment: Faces Faces Pain Scale: Hurts little more Pain Location: R knee Pain Descriptors / Indicators: Operative site guarding, Discomfort, Grimacing, Guarding Pain Intervention(s): Monitored during session, Repositioned (ice applied at end of session)    Home Living                          Prior Function            PT Goals (current goals can now be found in the care plan section) Acute Rehab PT Goals Patient Stated Goal: to walk better PT Goal Formulation: With patient Time For Goal Achievement: 11/02/22 Potential to Achieve Goals: Good Progress towards PT goals: Progressing toward goals    Frequency    7X/week      PT Plan Current plan remains appropriate    Co-evaluation              AM-PAC PT "6 Clicks" Mobility   Outcome Measure  Help needed turning from your back to your side while in a flat bed without using bedrails?: None Help needed moving from lying on your back to sitting on the side of a flat bed without using bedrails?: A Little Help needed moving to and from a bed to a chair (including a wheelchair)?: A Little Help needed standing up from a chair using your arms (e.g., wheelchair or bedside chair)?: A Little Help needed to walk in hospital room?: A Little Help needed climbing  3-5 steps with a railing? : A Little 6 Click Score: 19    End of Session Equipment Utilized During Treatment: Gait belt;Right knee immobilizer Activity Tolerance: Patient tolerated treatment well (pt with c/o nausea at end of session) Patient left: in chair;with chair alarm set;with call bell/phone within reach;with family/visitor present (ice applied on R knee) Nurse Communication: Mobility status PT Visit Diagnosis: Pain;Difficulty in walking, not elsewhere classified (R26.2);Other abnormalities of gait and mobility (R26.89) Pain - Right/Left: Right Pain - part of body: Knee     Time: 9371-6967 PT Time Calculation (min) (ACUTE ONLY): 38 min  Charges:  $Gait Training: 23-37 mins $Therapeutic Activity: 8-22 mins                     Gabrielle [name], PT, DPT 10/27/22, 11:27 AM   Gabrielle [name] 10/27/2022, 11:26 AM

## 2022-10-27 NOTE — Progress Notes (Signed)
Physical Therapy Treatment Patient Details Name: Gabrielle [name] MRN: [id number] DOB: 01/02/1946 Today's Date: 10/27/2022   History of Present Illness Pt is a 77yo female presenting s/p R-TKA on 10/26/22. PMH: HTN, OSA, glaucoma, hypothyroidism.    PT Comments    Pt seen for PT tx with pt agreeable, niece present for session. Pt is making steady progress with functional mobility as pt is able to complete STS from various surfaces with RW & supervision & increase ambulation distances with RW & supervision. Pt negotiates single step with RW & min assist with min cuing for compensatory pattern. Pt is eager to d/c home as soon as possible. Niece in room voices comfort with assisting pt home today.    Recommendations for follow up therapy are one component of a multi-disciplinary discharge planning process, led by the attending physician.  Recommendations may be updated based on patient status, additional functional criteria and insurance authorization.  Follow Up Recommendations  Follow physician's recommendations for discharge plan and follow up therapies (recommend HHPT)     Assistance Recommended at Discharge Frequent or constant Supervision/Assistance  Patient can return home with the following A little help with walking and/or transfers;A little help with bathing/dressing/bathroom;Assistance with cooking/housework;Assist for transportation;Help with stairs or ramp for entrance   Equipment Recommendations  None recommended by PT    Recommendations for Other Services       Precautions / Restrictions Precautions Precautions: Fall Required Braces or Orthoses: Knee Immobilizer - Right Knee Immobilizer - Right: On when out of bed or walking;Discontinue once straight leg raise with < 10 degree lag Restrictions Weight Bearing Restrictions: Yes RLE Weight Bearing: Weight bearing as tolerated     Mobility  Bed Mobility Overal bed mobility: Needs Assistance Bed Mobility: Supine to Sit      Supine to sit: Supervision, HOB elevated     General bed mobility comments: not tested, pt received & left sitting in recliner    Transfers Overall transfer level: Needs assistance Equipment used: Rolling walker (2 wheels) Transfers: Sit to/from Stand Sit to Stand: Supervision           General transfer comment: STS from recliner & toilet with use of grab bar and/or RW    Ambulation/Gait Ambulation/Gait assistance: Supervision Gait Distance (Feet):  (25 + 100 ft) Assistive device: Rolling walker (2 wheels) Gait Pattern/deviations: Step-to pattern, Decreased step length - left, Decreased step length - right, Decreased dorsiflexion - right, Decreased dorsiflexion - left, Decreased stride length Gait velocity: decreased     General Gait Details: Cuing for upright posture & to ambulate within base of AD   Stairs Stairs: Yes Stairs assistance: Min assist Stair Management: No rails, With walker, Backwards Number of Stairs: 1 General stair comments: 1 step x 2 times with min cuing for compensatory pattern. Niece present to observe, declines practicing but reports comfort from watching PT assist pt.   Wheelchair Mobility    Modified Rankin (Stroke Patients Only)       Balance Overall balance assessment: Needs assistance Sitting-balance support: Feet supported, No upper extremity supported Sitting balance-Leahy Scale: Good     Standing balance support: Reliant on assistive device for balance, During functional activity, Bilateral upper extremity supported Standing balance-Leahy Scale: Fair                              Cognition Arousal/Alertness: Awake/alert Behavior During Therapy: WFL for tasks assessed/performed Overall Cognitive Status: Within Functional Limits for  tasks assessed                                 General Comments: min cuing to recall compensatory pattern when negotiating stairs        Exercises      General  Comments General comments (skin integrity, edema, etc.): Pt with continent void during session, performs peri hygiene without assistance. Niece present for session.      Pertinent Vitals/Pain Pain Assessment Pain Assessment: Faces Faces Pain Scale: Hurts a little bit Pain Location: R knee Pain Descriptors / Indicators: Operative site guarding, Discomfort, Grimacing, Guarding Pain Intervention(s): Monitored during session    Home Living                          Prior Function            PT Goals (current goals can now be found in the care plan section) Acute Rehab PT Goals Patient Stated Goal: to walk better PT Goal Formulation: With patient Time For Goal Achievement: 11/02/22 Potential to Achieve Goals: Good Progress towards PT goals: Progressing toward goals    Frequency    7X/week      PT Plan Current plan remains appropriate    Co-evaluation              AM-PAC PT "6 Clicks" Mobility   Outcome Measure  Help needed turning from your back to your side while in a flat bed without using bedrails?: None Help needed moving from lying on your back to sitting on the side of a flat bed without using bedrails?: A Little Help needed moving to and from a bed to a chair (including a wheelchair)?: A Little Help needed standing up from a chair using your arms (e.g., wheelchair or bedside chair)?: A Little Help needed to walk in hospital room?: A Little Help needed climbing 3-5 steps with a railing? : A Little 6 Click Score: 19    End of Session Equipment Utilized During Treatment: Gait belt;Right knee immobilizer Activity Tolerance: Patient tolerated treatment well Patient left: in chair;with call bell/phone within reach;with nursing/sitter in room;with family/visitor present Nurse Communication: Mobility status PT Visit Diagnosis: Pain;Difficulty in walking, not elsewhere classified (R26.2);Other abnormalities of gait and mobility (R26.89) Pain -  Right/Left: Right Pain - part of body: Knee     Time: 4193-7902 PT Time Calculation (min) (ACUTE ONLY): 14 min  Charges:  $Gait Training: 23-37 mins $Therapeutic Activity: 8-22 mins                     Gabrielle [name], PT, DPT 10/27/22, 1:36 PM   Gabrielle [name] 10/27/2022, 1:36 PM

## 2022-10-27 NOTE — Progress Notes (Signed)
   Subjective: 1 Day Post-Op Procedure(s) (LRB): TOTAL KNEE ARTHROPLASTY (Right) Patient seen in rounds by Dr. Wynelle Link. Patient is well, and has had no acute complaints or problems. Denies SOB or chest pain. Denies calf pain. Voiding without difficulty. Patient reports pain as moderate. Worked with physical therapy yesterday but limited by pain. We will continue physical therapy today.  Objective: Vital signs in last 24 hours: Temp:  [97.5 F (36.4 C)-98.4 F (36.9 C)] 97.8 F (36.6 C) (01/23 0532) Pulse Rate:  [54-86] 59 (01/23 0532) Resp:  [0-18] 16 (01/23 0532) BP: (130-185)/(60-98) 130/63 (01/23 0532) SpO2:  [94 %-100 %] 99 % (01/23 0532) Weight:  [69.9 kg] 69.9 kg (01/22 0732)  Intake/Output from previous day:  Intake/Output Summary (Last 24 hours) at 10/27/2022 0710 Last data filed at 10/27/2022 0544 Gross per 24 hour  Intake 3212.5 ml  Output 500 ml  Net 2712.5 ml     Intake/Output this shift: No intake/output data recorded.  Labs: Recent Labs    10/27/22 0342  HGB 12.2   Recent Labs    10/27/22 0342  WBC 12.4*  RBC 3.78*  HCT 35.6*  PLT 152   Recent Labs    10/27/22 0342  NA 135  K 3.8  CL 105  CO2 22  BUN 9  CREATININE 0.68  GLUCOSE 143*  CALCIUM 9.8   No results for input(s): "LABPT", "INR" in the last 72 hours.  Exam: General - Patient is Alert and Oriented Extremity - Neurologically intact Neurovascular intact Sensation intact distally Dorsiflexion/Plantar flexion intact Dressing - dressing C/D/I Motor Function - intact, moving foot and toes well on exam.  Past Medical History:  Diagnosis Date   Arthritis    Glaucoma    Hypertension    Hypothyroidism    Sleep apnea    not used in 7-8 years per pt   Thyroid disease     Assessment/Plan: 1 Day Post-Op Procedure(s) (LRB): TOTAL KNEE ARTHROPLASTY (Right) Principal Problem:   OA (osteoarthritis) of knee Active Problems:   Primary osteoarthritis of right knee  Estimated body  mass index is 26.43 kg/m as calculated from the following:   Height as of this encounter: 5\' 4"  (1.626 m).   Weight as of this encounter: 69.9 kg. Advance diet Up with therapy D/C IV fluids   Patient's anticipated LOS is less than 2 midnights, meeting these requirements: - Lives within 1 hour of care - Has a competent adult at home to recover with post-op - NO history of  - Chronic pain requiring opiods  - Diabetes  - Coronary Artery Disease  - Heart failure  - Heart attack  - Stroke  - DVT/VTE  - Cardiac arrhythmia  - Respiratory Failure/COPD  - Renal failure  - Anemia  - Advanced Liver disease  DVT Prophylaxis - Xarelto Weight bearing as tolerated.  Continue physical therapy. Expected discharge home today pending progress and if meeting patient goals. [facility]uled [facility]. Follow-up in clinic in 2 weeks.  The PDMP database was reviewed today prior to any opioid medications being prescribed to this patient.   R. Jaynie Bream, PA-C Orthopedic Surgery [phone] 10/27/2022, 7:10 AM

## 2022-10-27 NOTE — Progress Notes (Signed)
Reviewed written d/c instructions w pt and her daughter and all questions answered. They both verbalized understanding. D/C via w/c w all belongings in stable condition. 

## 2022-10-28 ENCOUNTER — Encounter (HOSPITAL_COMMUNITY): Payer: Self-pay | Admitting: Orthopedic Surgery

## 2022-10-28 NOTE — Anesthesia Postprocedure Evaluation (Signed)
Anesthesia Post Note  Patient: Gabrielle [name]  Procedure(s) Performed: TOTAL KNEE ARTHROPLASTY (Right: Knee)     Patient location during evaluation: PACU Anesthesia Type: General Level of consciousness: awake and alert Pain management: pain level controlled Vital Signs Assessment: post-procedure vital signs reviewed and stable Respiratory status: spontaneous breathing, nonlabored ventilation, respiratory function stable and patient connected to nasal cannula oxygen Cardiovascular status: blood pressure returned to baseline and stable Postop Assessment: no apparent nausea or vomiting Anesthetic complications: no   No notable events documented.  Last Vitals:  Vitals:   10/27/22 0532 10/27/22 1312  BP: 130/63 (!) 150/69  Pulse: (!) 59 66  Resp: 16 18  Temp: 36.6 C 36.6 C  SpO2: 99% 96%    Last Pain:  Vitals:   10/27/22 1312  TempSrc: Oral  PainSc:                  [place]

## 2022-10-29 DIAGNOSIS — M25561 Pain in right knee: Secondary | ICD-10-CM | POA: Diagnosis not present

## 2022-11-02 DIAGNOSIS — M25561 Pain in right knee: Secondary | ICD-10-CM | POA: Diagnosis not present

## 2022-11-04 DIAGNOSIS — M25561 Pain in right knee: Secondary | ICD-10-CM | POA: Diagnosis not present

## 2022-11-06 DIAGNOSIS — M25561 Pain in right knee: Secondary | ICD-10-CM | POA: Diagnosis not present

## 2022-11-09 DIAGNOSIS — M25561 Pain in right knee: Secondary | ICD-10-CM | POA: Diagnosis not present

## 2022-11-11 DIAGNOSIS — M25561 Pain in right knee: Secondary | ICD-10-CM | POA: Diagnosis not present

## 2022-11-13 DIAGNOSIS — M25561 Pain in right knee: Secondary | ICD-10-CM | POA: Diagnosis not present

## 2022-11-16 DIAGNOSIS — M25561 Pain in right knee: Secondary | ICD-10-CM | POA: Diagnosis not present

## 2022-11-20 DIAGNOSIS — M25561 Pain in right knee: Secondary | ICD-10-CM | POA: Diagnosis not present

## 2022-11-23 DIAGNOSIS — M25561 Pain in right knee: Secondary | ICD-10-CM | POA: Diagnosis not present

## 2022-11-23 DIAGNOSIS — M5431 Sciatica, right side: Secondary | ICD-10-CM | POA: Diagnosis not present

## 2022-11-23 DIAGNOSIS — M1611 Unilateral primary osteoarthritis, right hip: Secondary | ICD-10-CM | POA: Diagnosis not present

## 2022-11-27 DIAGNOSIS — M25561 Pain in right knee: Secondary | ICD-10-CM | POA: Diagnosis not present

## 2022-11-30 DIAGNOSIS — E7800 Pure hypercholesterolemia, unspecified: Secondary | ICD-10-CM | POA: Diagnosis not present

## 2022-11-30 DIAGNOSIS — M25561 Pain in right knee: Secondary | ICD-10-CM | POA: Diagnosis not present

## 2022-12-03 DIAGNOSIS — Z5189 Encounter for other specified aftercare: Secondary | ICD-10-CM | POA: Diagnosis not present

## 2022-12-04 DIAGNOSIS — M25561 Pain in right knee: Secondary | ICD-10-CM | POA: Diagnosis not present

## 2022-12-09 DIAGNOSIS — M25561 Pain in right knee: Secondary | ICD-10-CM | POA: Diagnosis not present

## 2022-12-16 DIAGNOSIS — M25561 Pain in right knee: Secondary | ICD-10-CM | POA: Diagnosis not present

## 2022-12-16 DIAGNOSIS — H402232 Chronic angle-closure glaucoma, bilateral, moderate stage: Secondary | ICD-10-CM | POA: Diagnosis not present

## 2022-12-21 DIAGNOSIS — E7800 Pure hypercholesterolemia, unspecified: Secondary | ICD-10-CM | POA: Diagnosis not present

## 2022-12-21 DIAGNOSIS — M8580 Other specified disorders of bone density and structure, unspecified site: Secondary | ICD-10-CM | POA: Diagnosis not present

## 2022-12-21 DIAGNOSIS — I10 Essential (primary) hypertension: Secondary | ICD-10-CM | POA: Diagnosis not present

## 2022-12-21 DIAGNOSIS — K5900 Constipation, unspecified: Secondary | ICD-10-CM | POA: Diagnosis not present

## 2022-12-21 DIAGNOSIS — E039 Hypothyroidism, unspecified: Secondary | ICD-10-CM | POA: Diagnosis not present

## 2022-12-25 DIAGNOSIS — E039 Hypothyroidism, unspecified: Secondary | ICD-10-CM | POA: Diagnosis not present

## 2022-12-29 NOTE — H&P (Signed)
TOTAL KNEE ADMISSION H&P  Patient is being admitted for left total knee arthroplasty.  Subjective:  Chief Complaint: Left knee pain.  HPI: Gabrielle [name], 77 y.o. female has a history of pain and functional disability in the left knee due to arthritis and has failed non-surgical conservative treatments for greater than 12 weeks to include corticosteriod injections, viscosupplementation injections, and activity modification. Onset of symptoms was gradual, starting 5 years ago with gradually worsening course since that time. The patient noted no past surgery on the left knee.  Patient currently rates pain in the left knee at 6 out of 10 with activity. Patient has worsening of pain with activity and weight bearing and pain that interferes with activities of daily living. Patient has evidence of subchondral sclerosis, periarticular osteophytes, and joint space narrowing by imaging studies. There is no active infection.  Patient Active Problem List   Diagnosis Date Noted   OA (osteoarthritis) of knee 10/26/2022   Primary osteoarthritis of right knee 10/26/2022   Hypothyroidism 04/04/2022   Syncope and collapse 04/03/2022   Closed fracture of right side of mandible ([place]) 04/03/2022   HTN (hypertension) 04/03/2022   Hypokalemia 04/03/2022   Hypercalcemia 04/03/2022   Hyperlipidemia 04/03/2022    Past Medical History:  Diagnosis Date   Arthritis    Glaucoma    Hypertension    Hypothyroidism    Sleep apnea    not used in 7-8 years per pt   Thyroid disease     Past Surgical History:  Procedure Laterality Date   ABDOMINAL HYSTERECTOMY     bilateral cataract surgery      03/2022   BREAST EXCISIONAL BIOPSY Left    CLOSED REDUCTION MANDIBLE WITH MANDIBULOMA N/A 04/05/2022   Procedure: CLOSED REDUCTION MANDIBLE WITH MANDIBULOMAXILLARY FUSION;  Surgeon: Gardner Candle, DMD;  Location: [place];  Service: Oral Surgery;  Laterality: N/A;   dental implants      x 3 on 09/29/22   FOOT SURGERY   2019   Arch reconstruction   GLAUCOMA SURGERY     at same time as cataract surgery   SHOULDER SURGERY     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   Procedure: TOTAL KNEE ARTHROPLASTY;  Surgeon: Gaynelle Arabian, MD;  Location: [facility];  Service: Orthopedics;  Laterality: Right;    Prior to Admission medications   Medication Sig Start Date End Date Taking? Authorizing Provider  amLODipine (NORVASC) 2.5 MG tablet Take 2.5 mg by mouth at bedtime.   Yes [provider]  diclofenac Sodium (VOLTAREN) 1 % GEL Apply 2 g topically 4 (four) times daily as needed (for pain- shoulders, hands, and knees).   Yes [provider]  ezetimibe (ZETIA) 10 MG tablet Take 10 mg by mouth daily.   Yes [provider]  feeding supplement (ENSURE ENLIVE / ENSURE PLUS) LIQD Take 237 mLs by mouth 2 (two) times daily between meals. Patient taking differently: Take 237 mLs by mouth 2 (two) times daily between meals. Boost 04/05/22  Yes Lavina Hamman, MD  hydrochlorothiazide (HYDRODIURIL) 25 MG tablet Take 25 mg by mouth every morning. 08/28/22  Yes [provider]  levothyroxine (SYNTHROID, LEVOTHROID) 50 MCG tablet Take 50 mcg by mouth daily before breakfast.   Yes [provider]  magnesium gluconate (MAGONATE) 500 MG tablet Take 500 mg by mouth daily.   Yes [provider]  potassium chloride (KLOR-CON) 10 MEQ tablet Take 10 mEq by mouth every morning. 07/16/22  Yes [provider]  predniSONE (DELTASONE) 10 MG tablet Take 10 mg by mouth daily with breakfast. Taper 10/12/22  Yes [provider]  raloxifene (EVISTA) 60 MG tablet Take 60 mg by mouth every morning. 10/06/22  Yes [provider]  rosuvastatin (CRESTOR) 5 MG tablet Take 5 mg by mouth 3 (three) times a week.   Yes [provider]  triamcinolone cream (KENALOG) 0.1 % Apply 1 Application topically 2 (two) times daily.   Yes [provider]  ZYRTEC ALLERGY 10 MG tablet  Take 10 mg by mouth daily.   Yes [provider]  acetaminophen (TYLENOL) 160 MG/5ML liquid Take 15.6 mLs (500 mg total) by mouth every 4 (four) hours as needed for fever (mild pain). Patient not taking: Reported on 10/15/2022 04/05/22   Lavina Hamman, MD  chlorhexidine (PERIDEX) 0.12 % solution Use as directed 15 mLs in the mouth or throat 2 (two) times daily. Patient not taking: Reported on 10/15/2022 04/05/22   Lavina Hamman, MD  HYDROmorphone (DILAUDID) 2 MG tablet Take 1-2 tablets (2-4 mg total) by mouth every 6 (six) hours as needed for severe pain. 10/27/22   Jearld Lesch, PA  methocarbamol (ROBAXIN) 500 MG tablet Take 1 tablet (500 mg total) by mouth every 6 (six) hours as needed for muscle spasms. 10/27/22   Jearld Lesch, PA  polyethylene glycol (MIRALAX / GLYCOLAX) 17 g packet Take 17 g by mouth daily as needed for mild constipation. Patient not taking: Reported on 10/15/2022 04/05/22   Lavina Hamman, MD  traMADol (ULTRAM) 50 MG tablet Take 1-2 tablets (50-100 mg total) by mouth every 6 (six) hours as needed for moderate pain. 10/27/22   Jearld Lesch, PA    Allergies  Allergen Reactions   Lidocaine Swelling and Other (See Comments)    Twitching, facial swelling, and shaking     Aspirin Other (See Comments)    Increases heart rate    Hylan G-F 20 Swelling and Other (See Comments)    Decreased facial feeling and chills- also   Lisinopril Other (See Comments) and Cough    Dry cough   Losartan Other (See Comments)    Dizziness/headaches   Metoprolol Other (See Comments)    Dizziness   Olmesartan Other (See Comments)    Hair loss   Codeine Hives, Rash and Other (See Comments)    Hallucinations and syncope, also   Latex Rash   Sulfa Antibiotics Hives and Rash    Social History   Socioeconomic History   Marital status: Divorced    Spouse name: Not on file   Number of children: Not on file   Years of education: Not on file   Highest education level: Not  on file  Occupational History   Not on file  Tobacco Use   Smoking status: Never   Smokeless tobacco: Never  Vaping Use   Vaping Use: Never used  Substance and Sexual Activity   Alcohol use: No   Drug use: Never   Sexual activity: Never  Other Topics Concern   Not on file  Social History Narrative   Not on file   Social Determinants of Health   Financial Resource Strain: Not on file  Food Insecurity: No Food Insecurity (10/26/2022)   Hunger Vital Sign    Worried About Running Out of Food in the Last Year: Never true    Ran Out of Food in the Last Year: Never true  Transportation Needs: No Transportation Needs (10/26/2022)   PRAPARE - Transportation  Lack of Transportation (Medical): No    Lack of Transportation (Non-Medical): No  Physical Activity: Not on file  Stress: Not on file  Social Connections: Not on file  Intimate Partner Violence: Not At Risk (10/26/2022)   Humiliation, Afraid, Rape, and Kick questionnaire    Fear of Current or Ex-Partner: No    Emotionally Abused: No    Physically Abused: No    Sexually Abused: No    Tobacco Use: Low Risk  (10/28/2022)   Patient History    Smoking Tobacco Use: Never    Smokeless Tobacco Use: Never    Passive Exposure: Not on file   Social History   Substance and Sexual Activity  Alcohol Use No    Family History  Problem Relation Age of Onset   Diabetes Father    Cancer Father    Hypertension Father     ROS  Objective: The patient is a pleasant, well-developed female alert and oriented in no apparent distress.  Bilateral Hip Exam: The range of motion: normal without discomfort.  Right Knee Exam: No effusion present. No swelling present. The range of motion is: 0 to 130 degrees. Moderate crepitus on range of motion of the knee. Positive medial greater than lateral joint line tenderness. The knee is stable.  Left Knee Exam: No effusion present. No swelling present. The Range of motion is: 0 to 125  degrees. Moderate crepitus on range of motion of the knee. Positive medial joint line tenderness. Positive lateral joint line tenderness. The knee is stable.  The patient's sensation and motor function are intact in their lower extremities. Their distal pulses are 2+. The bilateral calves are soft and non-tender.  RESULTS  - AP and lateral of the bilateral knees dated 05/01/2022 demonstrate significant bone-on-bone arthritis in the lateral and patellofemoral compartments, both with significant medial narrowing. The patellofemoral disease is the worst of the 3 compartments in both knees  Assessment/Plan:  End stage arthritis, left knee   The patient history, physical examination, clinical judgment of the provider and imaging studies are consistent with end stage degenerative joint disease of the left knee and total knee arthroplasty is deemed medically necessary. The treatment options including medical management, injection therapy arthroscopy and arthroplasty were discussed at length. The risks and benefits of total knee arthroplasty were presented and reviewed. The risks due to aseptic loosening, infection, stiffness, patella tracking problems, thromboembolic complications and other imponderables were discussed. The patient acknowledged the explanation, agreed to proceed with the plan and consent was signed. Patient is being admitted for inpatient treatment for surgery, pain control, PT, OT, prophylactic antibiotics, VTE prophylaxis, progressive ambulation and ADLs and discharge planning. The patient is planning to be discharged home with OPPT scheduled.   Patient's anticipated LOS is less than 2 midnights, meeting these requirements: - Younger than 70 - Lives within 1 hour of care - Has a competent adult at home to recover with post-op recover - NO history of  - Chronic pain requiring opiods  - Diabetes  - Coronary Artery Disease  - Heart failure  - Heart attack  - Stroke  -  DVT/VTE  - Cardiac arrhythmia  - Respiratory Failure/COPD  - Renal failure  - Anemia  - Advanced Liver disease   Therapy Plans: EO Disposition: Home with Son or niece Planned DVT Prophylaxis: Xarelto 10 mg DME Needed: None PCP: Alyson Ingles, MD (clearance received) TXA: IV Allergies: aspirin (tremor), codeine (tremor), lidocaine (facial tremor), sulfa antibiotics, LATEX (hives) Anesthesia Concerns: None BMI: 26.7 Last  HgbA1c: not diabetic  Pharmacy: [facility] ([place])  Other: -hallucinations with hydromorphone after R TKA. Tolerated hydrocodone previously. Discussed tramadol and hydrocodone for L TKA. -No aquacel (blistering after R TKA)   - Patient was instructed on what medications to stop prior to surgery. - Follow-up visit in 2 weeks with Dr. Wynelle Link - Begin physical therapy following surgery - Pre-operative lab work as pre-surgical testing - Prescriptions will be provided in hospital at time of discharge  Shearon Balo, PA-C Orthopedic Surgery [facility] [place]

## 2023-01-01 DIAGNOSIS — M25561 Pain in right knee: Secondary | ICD-10-CM | POA: Diagnosis not present

## 2023-01-05 NOTE — Progress Notes (Signed)
Surgery orders requested via Epic inbox. °

## 2023-01-11 NOTE — Progress Notes (Signed)
COVID Vaccine Completed:  Date of COVID positive in last 90 days:  PCP - Elias Else, MD Cardiologist -   Chest x-ray -04/17/22 Epic  EKG - 04/27/22 Epic Stress Test -  ECHO - 04/07/23 Epic Cardiac Cath -  Pacemaker/ICD device last checked: Spinal Cord Stimulator:  Bowel Prep -   Sleep Study -  CPAP -   Fasting Blood Sugar -  Checks Blood Sugar _____ times a day  Last dose of GLP1 agonist-  N/A GLP1 instructions:  N/A   Last dose of SGLT-2 inhibitors-  N/A SGLT-2 instructions: N/A   Blood Thinner Instructions: Aspirin Instructions: Last Dose:  Activity level:  Can go up a flight of stairs and perform activities of daily living without stopping and without symptoms of chest pain or shortness of breath.  Able to exercise without symptoms  Unable to go up a flight of stairs without symptoms of     Anesthesia review:   Patient denies shortness of breath, fever, cough and chest pain at PAT appointment  Patient verbalized understanding of instructions that were given to them at the PAT appointment. Patient was also instructed that they will need to review over the PAT instructions again at home before surgery.

## 2023-01-12 ENCOUNTER — Encounter (HOSPITAL_COMMUNITY): Payer: Self-pay

## 2023-01-12 ENCOUNTER — Other Ambulatory Visit: Payer: Self-pay

## 2023-01-12 ENCOUNTER — Encounter (HOSPITAL_COMMUNITY)
Admission: RE | Admit: 2023-01-12 | Discharge: 2023-01-12 | Disposition: A | Discharge status: Home / Self Care | Payer: Medicare PPO | Source: Ambulatory Visit | Attending: Orthopedic Surgery | Admitting: Orthopedic Surgery

## 2023-01-12 VITALS — BP 132/71 | HR 79 | Temp 98.6°F | Resp 18 | Ht 65.0 in | Wt 147.0 lb

## 2023-01-12 DIAGNOSIS — Z01812 Encounter for preprocedural laboratory examination: Secondary | ICD-10-CM | POA: Insufficient documentation

## 2023-01-12 DIAGNOSIS — R7303 Prediabetes: Secondary | ICD-10-CM | POA: Insufficient documentation

## 2023-01-12 DIAGNOSIS — I10 Essential (primary) hypertension: Secondary | ICD-10-CM | POA: Diagnosis not present

## 2023-01-12 DIAGNOSIS — Z01818 Encounter for other preprocedural examination: Secondary | ICD-10-CM

## 2023-01-12 HISTORY — DX: Unspecified asthma, uncomplicated: J45.909

## 2023-01-12 LAB — BASIC METABOLIC PANEL
Anion gap: 6 (ref 5–15)
BUN: 10 mg/dL (ref 8–23)
CO2: 25 mmol/L (ref 22–32)
Calcium: 9.9 mg/dL (ref 8.9–10.3)
Chloride: 108 mmol/L (ref 98–111)
Creatinine, Ser: 0.57 mg/dL (ref 0.44–1.00)
GFR, Estimated: >60 mL/min (ref >60)
Glucose, Bld: 100 mg/dL — ABNORMAL HIGH (ref 70–99)
Potassium: 3.7 mmol/L (ref 3.5–5.1)
Sodium: 139 mmol/L (ref 135–145)

## 2023-01-12 LAB — SURGICAL PCR SCREEN
MRSA, PCR: NEGATIVE
Staphylococcus aureus: NEGATIVE

## 2023-01-12 LAB — CBC
HCT: 39.2 % (ref 36.0–46.0)
Hemoglobin: 12.6 g/dL (ref 12.0–15.0)
MCH: 30.5 pg (ref 26.0–34.0)
MCHC: 32.1 g/dL (ref 30.0–36.0)
MCV: 94.9 fL (ref 80.0–100.0)
Platelets: 187 10*3/uL (ref 150–400)
RBC: 4.13 MIL/uL (ref 3.87–5.11)
RDW: 12.6 % (ref 11.5–15.5)
WBC: 4.3 10*3/uL (ref 4.0–10.5)
nRBC: 0 % (ref 0.0–0.2)

## 2023-01-12 LAB — GLUCOSE, CAPILLARY: Glucose-Capillary: 141 mg/dL — ABNORMAL HIGH (ref 70–99)

## 2023-01-12 LAB — HEMOGLOBIN A1C
Hgb A1c MFr Bld: 5.6 % (ref 4.8–5.6)
Mean Plasma Glucose: 114.02 mg/dL

## 2023-01-12 NOTE — Progress Notes (Signed)
Patient had a reaction to the dressing during her last knee replacement. Per patient she broke out in blisters. Dr. Lequita Halt is aware.  COVID Vaccine Completed: yes   Date of COVID positive in last 90 days: no   PCP - [place], previous PCP retired, has not seen yet Cardiologist - 20 years ago per pt, was having palpitations, no follow up needed per pt   Chest x-ray -04/17/22 Epic  EKG - 04/27/22 Epic Stress Test - n/a ECHO - 04/06/22 Epic Cardiac Cath - n/a Pacemaker/ICD device last checked: n/a Spinal Cord Stimulator: n/a   Bowel Prep - no   Sleep Study - yes CPAP - not in 8 years   Fasting Blood Sugar - pre DM 6+ years ago per pt Checks Blood Sugar no checks at home   Last dose of GLP1 agonist-  N/A GLP1 instructions:  N/A   Last dose of SGLT-2 inhibitors-  N/A SGLT-2 instructions: N/A     Blood Thinner Instructions: n/a Aspirin Instructions: Last Dose:   Activity level:   Can go up a flight of stairs and perform activities of daily living without stopping and without symptoms of chest pain or shortness of breath.   Anesthesia review:   Patient denies shortness of breath, fever, cough and chest pain at PAT appointment   Patient verbalized understanding of instructions that were given to them at the PAT appointment. Patient was also instructed that they will need to review over the PAT instructions again at home before surgery.

## 2023-01-12 NOTE — Patient Instructions (Signed)
SURGICAL WAITING ROOM VISITATION  Patients having surgery or a procedure may have no more than 2 support people in the waiting area - these visitors may rotate.    Children under the age of 75 must have an adult with them who is not the patient.  Due to an increase in RSV and influenza rates and associated hospitalizations, children ages 5412 and under may not visit patients in [facility] hospitals.  If the patient needs to stay at the hospital during part of their recovery, the visitor guidelines for inpatient rooms apply. Pre-op nurse will coordinate an appropriate time for 1 support person to accompany patient in pre-op.  This support person may not rotate.    Please refer to the [facility] website for the visitor guidelines for Inpatients (after your surgery is over and you are in a regular room).    Your procedure is scheduled on: 01/25/23   [facility] Main [facility]    Report to admitting at 8:00 AM   Call this number if you have problems the morning of surgery [phone]   Do not eat food :After Midnight.   After Midnight you may have the following liquids until 7:30 AM DAY OF SURGERY  Water Non-Citrus Juices (without pulp, NO RED-Apple, White grape, White cranberry) Black Coffee (NO MILK/CREAM OR CREAMERS, sugar ok)  Clear Tea (NO MILK/CREAM OR CREAMERS, sugar ok) regular and decaf                             Plain Jell-O (NO RED)                                           Fruit ices (not with fruit pulp, NO RED)                                     Popsicles (NO RED)                                                               Sports drinks like Gatorade (NO RED)              The day of surgery:  Drink ONE (1) Pre-Surgery Clear Ensure at 7:30 AM the morning of surgery. Drink in one sitting. Do not sip.  This drink was given to you during your hospital  pre-op appointment visit. Nothing else to drink after completing the  Pre-Surgery Clear Ensure.           If you have questions, please contact your surgeon's office.   FOLLOW BOWEL PREP AND ANY ADDITIONAL PRE OP INSTRUCTIONS YOU RECEIVED FROM YOUR SURGEON'S OFFICE!!!     Oral Hygiene is also important to reduce your risk of infection.                                    Remember - BRUSH YOUR TEETH THE MORNING OF SURGERY WITH YOUR REGULAR TOOTHPASTE  DENTURES WILL BE REMOVED PRIOR  TO SURGERY PLEASE DO NOT APPLY "Poly grip" OR ADHESIVES!!!   Take these medicines the morning of surgery with A SIP OF WATER: Levothyroxine, Rosuvastatin, Tylenol                              You may not have any metal on your body including hair pins, jewelry, and body piercing             Do not wear make-up, lotions, powders, perfumes/cologne, or deodorant  Do not wear nail polish including gel and S&S, artificial/acrylic nails, or any other type of covering on natural nails including finger and toenails. If you have artificial nails, gel coating, etc. that needs to be removed by a nail salon please have this removed prior to surgery or surgery may need to be canceled/ delayed if the surgeon/ anesthesia feels like they are unable to be safely monitored.   Do not shave  48 hours prior to surgery.    Do not bring valuables to the hospital. [facility] IS NOT             RESPONSIBLE   FOR VALUABLES.   Contacts, glasses, dentures or bridgework may not be worn into surgery.   Bring small overnight bag day of surgery.   DO NOT BRING YOUR HOME MEDICATIONS TO THE HOSPITAL. PHARMACY WILL DISPENSE MEDICATIONS LISTED ON YOUR MEDICATION LIST TO YOU DURING YOUR ADMISSION IN THE HOSPITAL!   Special Instructions: Bring a copy of your healthcare power of attorney and living will documents the day of surgery if you haven't scanned them before.              Please read over the following fact sheets you were given: IF YOU HAVE QUESTIONS ABOUT YOUR PRE-OP INSTRUCTIONS PLEASE CALL [name]    If you  received a COVID test during your pre-op visit  it is requested that you wear a mask when out in public, stay away from anyone that may not be feeling well and notify your surgeon if you develop symptoms. If you test positive for Covid or have been in contact with anyone that has tested positive in the last 10 days please notify you surgeon.  FAILURE TO FOLLOW THESE INSTRUCTIONS MAY RESULT IN THE CANCELLATION OF YOUR SURGERY  PATIENT SIGNATURE_________________________________  NURSE SIGNATURE__________________________________  ________________________________________________________________________  Gabrielle [name]  An incentive spirometer is a tool that can help keep your lungs clear and active. This tool measures how well you are filling your lungs with each breath. Taking long deep breaths may help reverse or decrease the chance of developing breathing (pulmonary) problems (especially infection) following: A long period of time when you are unable to move or be active. BEFORE THE PROCEDURE  If the spirometer includes an indicator to show your best effort, your nurse or respiratory therapist will set it to a desired goal. If possible, sit up straight or lean slightly forward. Try not to slouch. Hold the incentive spirometer in an upright position. INSTRUCTIONS FOR USE  Sit on the edge of your bed if possible, or sit up as far as you can in bed or on a chair. Hold the incentive spirometer in an upright position. Breathe out normally. Place the mouthpiece in your mouth and seal your lips tightly around it. Breathe in slowly and as deeply as possible, raising the piston or the ball toward the top of the column. Hold your breath for 3-5 seconds  or for as long as possible. Allow the piston or ball to fall to the bottom of the column. Remove the mouthpiece from your mouth and breathe out normally. Rest for a few seconds and repeat Steps 1 through 7 at least 10 times every 1-2 hours when  you are awake. Take your time and take a few normal breaths between deep breaths. The spirometer may include an indicator to show your best effort. Use the indicator as a goal to work toward during each repetition. After each set of 10 deep breaths, practice coughing to be sure your lungs are clear. If you have an incision (the cut made at the time of surgery), support your incision when coughing by placing a pillow or rolled up towels firmly against it. Once you are able to get out of bed, walk around indoors and cough well. You may stop using the incentive spirometer when instructed by your caregiver.  RISKS AND COMPLICATIONS Take your time so you do not get dizzy or light-headed. If you are in pain, you may need to take or ask for pain medication before doing incentive spirometry. It is harder to take a deep breath if you are having pain. AFTER USE Rest and breathe slowly and easily. It can be helpful to keep track of a log of your progress. Your caregiver can provide you with a simple table to help with this. If you are using the spirometer at home, follow these instructions: SEEK MEDICAL CARE IF:  You are having difficultly using the spirometer. You have trouble using the spirometer as often as instructed. Your pain medication is not giving enough relief while using the spirometer. You develop fever of 100.5 F (38.1 C) or higher. SEEK IMMEDIATE MEDICAL CARE IF:  You cough up bloody sputum that had not been present before. You develop fever of 102 F (38.9 C) or greater. You develop worsening pain at or near the incision site. MAKE SURE YOU:  Understand these instructions. Will watch your condition. Will get help right away if you are not doing well or get worse. Document Released: 02/01/2007 Document Revised: 12/14/2011 Document Reviewed: 04/04/2007 [facility] Patient Information 2014 [place], [place].   ________________________________________________________________________

## 2023-01-25 ENCOUNTER — Other Ambulatory Visit: Payer: Self-pay

## 2023-01-25 ENCOUNTER — Observation Stay (HOSPITAL_COMMUNITY)
Admission: RE | Admit: 2023-01-25 | Discharge: 2023-01-27 | Disposition: A | Discharge status: Home / Self Care | Payer: Medicare PPO | Source: Ambulatory Visit | Attending: Orthopedic Surgery | Admitting: Orthopedic Surgery

## 2023-01-25 ENCOUNTER — Encounter (HOSPITAL_COMMUNITY)
Admission: RE | Disposition: A | Discharge status: Home / Self Care | Payer: Self-pay | Source: Ambulatory Visit | Attending: Orthopedic Surgery

## 2023-01-25 ENCOUNTER — Ambulatory Visit (HOSPITAL_BASED_OUTPATIENT_CLINIC_OR_DEPARTMENT_OTHER): Payer: Medicare PPO | Admitting: Certified Registered Nurse Anesthetist

## 2023-01-25 ENCOUNTER — Encounter (HOSPITAL_COMMUNITY): Payer: Self-pay | Admitting: Orthopedic Surgery

## 2023-01-25 ENCOUNTER — Ambulatory Visit (HOSPITAL_COMMUNITY): Payer: Medicare PPO | Admitting: Certified Registered Nurse Anesthetist

## 2023-01-25 DIAGNOSIS — M1712 Unilateral primary osteoarthritis, left knee: Principal | ICD-10-CM | POA: Diagnosis present

## 2023-01-25 DIAGNOSIS — E039 Hypothyroidism, unspecified: Secondary | ICD-10-CM | POA: Diagnosis not present

## 2023-01-25 DIAGNOSIS — Z79899 Other long term (current) drug therapy: Secondary | ICD-10-CM | POA: Insufficient documentation

## 2023-01-25 DIAGNOSIS — I10 Essential (primary) hypertension: Secondary | ICD-10-CM

## 2023-01-25 DIAGNOSIS — Z01818 Encounter for other preprocedural examination: Secondary | ICD-10-CM

## 2023-01-25 DIAGNOSIS — J45909 Unspecified asthma, uncomplicated: Secondary | ICD-10-CM | POA: Insufficient documentation

## 2023-01-25 DIAGNOSIS — G8918 Other acute postprocedural pain: Secondary | ICD-10-CM | POA: Diagnosis not present

## 2023-01-25 DIAGNOSIS — Z91040 Latex allergy status: Secondary | ICD-10-CM | POA: Insufficient documentation

## 2023-01-25 DIAGNOSIS — Z96651 Presence of right artificial knee joint: Secondary | ICD-10-CM | POA: Diagnosis not present

## 2023-01-25 DIAGNOSIS — M179 Osteoarthritis of knee, unspecified: Secondary | ICD-10-CM

## 2023-01-25 HISTORY — PX: TOTAL KNEE ARTHROPLASTY: SHX125

## 2023-01-25 LAB — GLUCOSE, CAPILLARY: Glucose-Capillary: 169 mg/dL — ABNORMAL HIGH (ref 70–99)

## 2023-01-25 SURGERY — ARTHROPLASTY, KNEE, TOTAL
Anesthesia: Monitor Anesthesia Care | Site: Knee | Laterality: Left

## 2023-01-25 MED ORDER — EZETIMIBE 10 MG PO TABS
10.0000 mg | ORAL_TABLET | Freq: Every day | ORAL | Status: DC
  Administered 2023-01-26 – 2023-01-27 (×2): 10 mg via ORAL
  Filled 2023-01-25 (×2): qty 1

## 2023-01-25 MED ORDER — CHLORHEXIDINE GLUCONATE 0.12 % MT SOLN
15.0000 mL | Freq: Once | OROMUCOSAL | Status: AC
  Administered 2023-01-25: 15 mL via OROMUCOSAL

## 2023-01-25 MED ORDER — AMLODIPINE BESYLATE 5 MG PO TABS
5.0000 mg | ORAL_TABLET | Freq: Every day | ORAL | Status: DC
  Administered 2023-01-26 – 2023-01-27 (×2): 5 mg via ORAL
  Filled 2023-01-25 (×2): qty 1

## 2023-01-25 MED ORDER — POLYETHYLENE GLYCOL 3350 17 G PO PACK: 17.0000 g | PACK | Freq: Every day | ORAL | Status: DC | PRN

## 2023-01-25 MED ORDER — TRAMADOL HCL 50 MG PO TABS
50.0000 mg | ORAL_TABLET | Freq: Four times a day (QID) | ORAL | Status: DC | PRN
  Administered 2023-01-25 (×2): 50 mg via ORAL
  Administered 2023-01-26: 100 mg via ORAL
  Filled 2023-01-25: qty 1
  Filled 2023-01-25: qty 2

## 2023-01-25 MED ORDER — POTASSIUM CHLORIDE CRYS ER 10 MEQ PO TBCR
10.0000 meq | EXTENDED_RELEASE_TABLET | Freq: Every morning | ORAL | Status: DC
  Administered 2023-01-25 – 2023-01-27 (×3): 10 meq via ORAL
  Filled 2023-01-25 (×4): qty 1

## 2023-01-25 MED ORDER — RIVAROXABAN 10 MG PO TABS
10.0000 mg | ORAL_TABLET | Freq: Every day | ORAL | Status: DC
  Administered 2023-01-26 – 2023-01-27 (×2): 10 mg via ORAL
  Filled 2023-01-25 (×2): qty 1

## 2023-01-25 MED ORDER — SODIUM CHLORIDE 0.9 % IV SOLN
INTRAVENOUS | Status: DC | PRN
  Administered 2023-01-25: 80 mL

## 2023-01-25 MED ORDER — ROSUVASTATIN CALCIUM 5 MG PO TABS
5.0000 mg | ORAL_TABLET | ORAL | Status: DC
  Administered 2023-01-27: 5 mg via ORAL
  Filled 2023-01-25: qty 1

## 2023-01-25 MED ORDER — PHENYLEPHRINE HCL-NACL 20-0.9 MG/250ML-% IV SOLN
INTRAVENOUS | Status: DC | PRN
  Administered 2023-01-25: 35 ug/min via INTRAVENOUS

## 2023-01-25 MED ORDER — TRAMADOL HCL 50 MG PO TABS
ORAL_TABLET | ORAL | Status: AC
  Filled 2023-01-25: qty 1

## 2023-01-25 MED ORDER — METHOCARBAMOL 1000 MG/10ML IJ SOLN: 500.0000 mg | Freq: Four times a day (QID) | INTRAVENOUS | Status: DC | PRN

## 2023-01-25 MED ORDER — SODIUM CHLORIDE 0.9 % IV SOLN: INTRAVENOUS | Status: DC

## 2023-01-25 MED ORDER — LEVOTHYROXINE SODIUM 50 MCG PO TABS
50.0000 ug | ORAL_TABLET | Freq: Every day | ORAL | Status: DC
  Administered 2023-01-26 – 2023-01-27 (×2): 50 ug via ORAL
  Filled 2023-01-25 (×2): qty 1

## 2023-01-25 MED ORDER — MIDAZOLAM HCL 2 MG/2ML IJ SOLN: 1.0000 mg | INTRAMUSCULAR | Status: DC

## 2023-01-25 MED ORDER — ONDANSETRON HCL 4 MG PO TABS: 4.0000 mg | ORAL_TABLET | Freq: Four times a day (QID) | ORAL | Status: DC | PRN

## 2023-01-25 MED ORDER — PROPOFOL 10 MG/ML IV BOLUS
INTRAVENOUS | Status: DC | PRN
  Administered 2023-01-25 (×2): 10 mg via INTRAVENOUS

## 2023-01-25 MED ORDER — DEXAMETHASONE SODIUM PHOSPHATE 10 MG/ML IJ SOLN: 10.0000 mg | Freq: Once | INTRAMUSCULAR | Status: DC

## 2023-01-25 MED ORDER — BUPIVACAINE HCL (PF) 0.5 % IJ SOLN
INTRAMUSCULAR | Status: DC | PRN
  Administered 2023-01-25: 15 mL via PERINEURAL

## 2023-01-25 MED ORDER — SODIUM CHLORIDE (PF) 0.9 % IJ SOLN
INTRAMUSCULAR | Status: AC
  Filled 2023-01-25: qty 10

## 2023-01-25 MED ORDER — PROPOFOL 500 MG/50ML IV EMUL
INTRAVENOUS | Status: DC | PRN
  Administered 2023-01-25: 50 ug/kg/min via INTRAVENOUS

## 2023-01-25 MED ORDER — ACETAMINOPHEN 10 MG/ML IV SOLN
1000.0000 mg | Freq: Four times a day (QID) | INTRAVENOUS | Status: DC
  Administered 2023-01-25: 1000 mg via INTRAVENOUS
  Filled 2023-01-25: qty 100

## 2023-01-25 MED ORDER — TRANEXAMIC ACID-NACL 1000-0.7 MG/100ML-% IV SOLN
1000.0000 mg | INTRAVENOUS | Status: AC
  Administered 2023-01-25: 1000 mg via INTRAVENOUS
  Filled 2023-01-25: qty 100

## 2023-01-25 MED ORDER — HYDROMORPHONE HCL 1 MG/ML IJ SOLN
0.5000 mg | INTRAMUSCULAR | Status: DC | PRN
  Filled 2023-01-25: qty 1

## 2023-01-25 MED ORDER — PHENOL 1.4 % MT LIQD: 1.0000 | OROMUCOSAL | Status: DC | PRN

## 2023-01-25 MED ORDER — ONDANSETRON HCL 4 MG/2ML IJ SOLN
INTRAMUSCULAR | Status: AC
  Filled 2023-01-25: qty 2

## 2023-01-25 MED ORDER — DOCUSATE SODIUM 100 MG PO CAPS
100.0000 mg | ORAL_CAPSULE | Freq: Two times a day (BID) | ORAL | Status: DC
  Administered 2023-01-25 – 2023-01-27 (×4): 100 mg via ORAL
  Filled 2023-01-25 (×4): qty 1

## 2023-01-25 MED ORDER — BUPIVACAINE LIPOSOME 1.3 % IJ SUSP: 20.0000 mL | Freq: Once | INTRAMUSCULAR | Status: DC

## 2023-01-25 MED ORDER — METOCLOPRAMIDE HCL 5 MG/ML IJ SOLN: 5.0000 mg | Freq: Three times a day (TID) | INTRAMUSCULAR | Status: DC | PRN

## 2023-01-25 MED ORDER — SODIUM CHLORIDE 0.9 % IR SOLN
Status: DC | PRN
  Administered 2023-01-25: 1000 mL

## 2023-01-25 MED ORDER — METOCLOPRAMIDE HCL 5 MG PO TABS: 5.0000 mg | ORAL_TABLET | Freq: Three times a day (TID) | ORAL | Status: DC | PRN

## 2023-01-25 MED ORDER — DEXAMETHASONE SODIUM PHOSPHATE 10 MG/ML IJ SOLN
INTRAMUSCULAR | Status: AC
  Filled 2023-01-25: qty 1

## 2023-01-25 MED ORDER — FENTANYL CITRATE PF 50 MCG/ML IJ SOSY
50.0000 ug | PREFILLED_SYRINGE | INTRAMUSCULAR | Status: DC
  Administered 2023-01-25: 50 ug via INTRAVENOUS
  Filled 2023-01-25: qty 1

## 2023-01-25 MED ORDER — POVIDONE-IODINE 10 % EX SWAB: 2.0000 | Freq: Once | CUTANEOUS | Status: DC

## 2023-01-25 MED ORDER — SODIUM CHLORIDE (PF) 0.9 % IJ SOLN
INTRAMUSCULAR | Status: AC
  Filled 2023-01-25: qty 50

## 2023-01-25 MED ORDER — BUPIVACAINE IN DEXTROSE 0.75-8.25 % IT SOLN
INTRATHECAL | Status: DC | PRN
  Administered 2023-01-25: 1.6 mL via INTRATHECAL

## 2023-01-25 MED ORDER — ORAL CARE MOUTH RINSE: 15.0000 mL | Freq: Once | OROMUCOSAL | Status: AC

## 2023-01-25 MED ORDER — LACTATED RINGERS IV SOLN: INTRAVENOUS | Status: DC

## 2023-01-25 MED ORDER — ACETAMINOPHEN 500 MG PO TABS
500.0000 mg | ORAL_TABLET | Freq: Four times a day (QID) | ORAL | Status: DC
  Filled 2023-01-25: qty 1

## 2023-01-25 MED ORDER — ONDANSETRON HCL 4 MG/2ML IJ SOLN: 4.0000 mg | Freq: Four times a day (QID) | INTRAMUSCULAR | Status: DC | PRN

## 2023-01-25 MED ORDER — ONDANSETRON HCL 4 MG/2ML IJ SOLN
INTRAMUSCULAR | Status: DC | PRN
  Administered 2023-01-25: 4 mg via INTRAVENOUS

## 2023-01-25 MED ORDER — METHOCARBAMOL 500 MG PO TABS
500.0000 mg | ORAL_TABLET | Freq: Four times a day (QID) | ORAL | Status: DC | PRN
  Administered 2023-01-26 – 2023-01-27 (×3): 500 mg via ORAL
  Filled 2023-01-25 (×3): qty 1

## 2023-01-25 MED ORDER — BUPIVACAINE LIPOSOME 1.3 % IJ SUSP
INTRAMUSCULAR | Status: AC
  Filled 2023-01-25: qty 20

## 2023-01-25 MED ORDER — CEFAZOLIN SODIUM-DEXTROSE 2-4 GM/100ML-% IV SOLN
2.0000 g | Freq: Four times a day (QID) | INTRAVENOUS | Status: AC
  Administered 2023-01-25 (×2): 2 g via INTRAVENOUS
  Filled 2023-01-25 (×3): qty 100

## 2023-01-25 MED ORDER — HYDROCODONE-ACETAMINOPHEN 5-325 MG PO TABS
1.0000 | ORAL_TABLET | ORAL | Status: DC | PRN
  Administered 2023-01-25 – 2023-01-26 (×2): 1 via ORAL
  Administered 2023-01-26 – 2023-01-27 (×4): 2 via ORAL
  Filled 2023-01-25: qty 2
  Filled 2023-01-25: qty 1
  Filled 2023-01-25: qty 2
  Filled 2023-01-25: qty 1
  Filled 2023-01-25 (×2): qty 2

## 2023-01-25 MED ORDER — MENTHOL 3 MG MT LOZG: 1.0000 | LOZENGE | OROMUCOSAL | Status: DC | PRN

## 2023-01-25 MED ORDER — FLEET ENEMA 7-19 GM/118ML RE ENEM: 1.0000 | ENEMA | Freq: Once | RECTAL | Status: DC | PRN

## 2023-01-25 MED ORDER — DIPHENHYDRAMINE HCL 12.5 MG/5ML PO ELIX: 12.5000 mg | ORAL_SOLUTION | ORAL | Status: DC | PRN

## 2023-01-25 MED ORDER — BISACODYL 10 MG RE SUPP: 10.0000 mg | Freq: Every day | RECTAL | Status: DC | PRN

## 2023-01-25 MED ORDER — PROPOFOL 1000 MG/100ML IV EMUL
INTRAVENOUS | Status: AC
  Filled 2023-01-25: qty 100

## 2023-01-25 MED ORDER — CEFAZOLIN SODIUM-DEXTROSE 2-4 GM/100ML-% IV SOLN
2.0000 g | INTRAVENOUS | Status: AC
  Administered 2023-01-25: 2 g via INTRAVENOUS
  Filled 2023-01-25: qty 100

## 2023-01-25 MED ORDER — DEXAMETHASONE SODIUM PHOSPHATE 10 MG/ML IJ SOLN
8.0000 mg | Freq: Once | INTRAMUSCULAR | Status: AC
  Administered 2023-01-25: 8 mg via INTRAVENOUS

## 2023-01-25 MED ORDER — ACETAMINOPHEN 500 MG PO TABS
500.0000 mg | ORAL_TABLET | Freq: Four times a day (QID) | ORAL | Status: AC
  Administered 2023-01-25 – 2023-01-26 (×3): 500 mg via ORAL
  Filled 2023-01-25 (×3): qty 1

## 2023-01-25 SURGICAL SUPPLY — 63 items
ATTUNE PS FEM LT SZ 5 CEM KNEE (Femur) IMPLANT
ATTUNE PSRP INSE SZ5 7 KNEE (Insert) IMPLANT
BAG COUNTER SPONGE SURGICOUNT (BAG) IMPLANT
BAG SPEC THK2 15X12 ZIP CLS (MISCELLANEOUS) ×1
BAG SPNG CNTER NS LX DISP (BAG)
BAG ZIPLOCK 12X15 (MISCELLANEOUS) ×2 IMPLANT
BASE TIBIAL ROT PLAT SZ 5 KNEE (Knees) IMPLANT
BLADE SAG 18X100X1.27 (BLADE) ×2 IMPLANT
BLADE SAW SGTL 11.0X1.19X90.0M (BLADE) ×2 IMPLANT
BNDG CMPR 5X62 HK CLSR LF (GAUZE/BANDAGES/DRESSINGS) ×1
BNDG CMPR MED 10X6 ELC LF (GAUZE/BANDAGES/DRESSINGS) ×1
BNDG ELASTIC 6INX 5YD STR LF (GAUZE/BANDAGES/DRESSINGS) ×2 IMPLANT
BNDG ELASTIC 6X10 VLCR STRL LF (GAUZE/BANDAGES/DRESSINGS) IMPLANT
BOWL SMART MIX CTS (DISPOSABLE) ×2 IMPLANT
BSPLAT TIB 5 CMNT ROT PLAT STR (Knees) ×1 IMPLANT
CEMENT HV SMART SET (Cement) ×4 IMPLANT
COVER SURGICAL LIGHT HANDLE (MISCELLANEOUS) ×2 IMPLANT
CUFF TOURN SGL QUICK 34 (TOURNIQUET CUFF) ×1
CUFF TRNQT CYL 34X4.125X (TOURNIQUET CUFF) ×2 IMPLANT
DRAPE INCISE IOBAN 66X45 STRL (DRAPES) ×2 IMPLANT
DRAPE U-SHAPE 47X51 STRL (DRAPES) ×2 IMPLANT
DRSG ADAPTIC 3X8 NADH LF (GAUZE/BANDAGES/DRESSINGS) IMPLANT
DRSG AQUACEL AG ADV 3.5X10 (GAUZE/BANDAGES/DRESSINGS) ×2 IMPLANT
DURAPREP 26ML APPLICATOR (WOUND CARE) ×2 IMPLANT
ELECT REM PT RETURN 15FT ADLT (MISCELLANEOUS) ×2 IMPLANT
GAUZE PAD ABD 7.5X8 STRL (GAUZE/BANDAGES/DRESSINGS) IMPLANT
GAUZE SPONGE 4X4 12PLY STRL (GAUZE/BANDAGES/DRESSINGS) IMPLANT
GLOVE BIO SURGEON STRL SZ 6.5 (GLOVE) IMPLANT
GLOVE BIO SURGEON STRL SZ7.5 (GLOVE) IMPLANT
GLOVE BIO SURGEON STRL SZ8 (GLOVE) ×2 IMPLANT
GLOVE BIOGEL PI IND STRL 6.5 (GLOVE) IMPLANT
GLOVE BIOGEL PI IND STRL 7.0 (GLOVE) IMPLANT
GLOVE BIOGEL PI IND STRL 8 (GLOVE) ×2 IMPLANT
GOWN STRL REUS W/ TWL LRG LVL3 (GOWN DISPOSABLE) ×2 IMPLANT
GOWN STRL REUS W/ TWL XL LVL3 (GOWN DISPOSABLE) IMPLANT
GOWN STRL REUS W/TWL LRG LVL3 (GOWN DISPOSABLE) ×1
GOWN STRL REUS W/TWL XL LVL3 (GOWN DISPOSABLE)
HANDPIECE INTERPULSE COAX TIP (DISPOSABLE) ×1
HOLDER FOLEY CATH W/STRAP (MISCELLANEOUS) IMPLANT
IMMOBILIZER KNEE 20 (SOFTGOODS) ×1
IMMOBILIZER KNEE 20 THIGH 36 (SOFTGOODS) ×2 IMPLANT
KIT TURNOVER KIT A (KITS) IMPLANT
MANIFOLD NEPTUNE II (INSTRUMENTS) ×2 IMPLANT
NS IRRIG 1000ML POUR BTL (IV SOLUTION) ×2 IMPLANT
PACK TOTAL KNEE CUSTOM (KITS) ×2 IMPLANT
PADDING CAST ABS COTTON 6X4 NS (CAST SUPPLIES) IMPLANT
PADDING CAST COTTON 6X4 STRL (CAST SUPPLIES) ×4 IMPLANT
PATELLA MEDIAL ATTUN 35MM KNEE (Knees) IMPLANT
PIN STEINMAN FIXATION KNEE (PIN) IMPLANT
PROTECTOR NERVE ULNAR (MISCELLANEOUS) ×2 IMPLANT
SET HNDPC FAN SPRY TIP SCT (DISPOSABLE) ×2 IMPLANT
SPIKE FLUID TRANSFER (MISCELLANEOUS) ×2 IMPLANT
STRIP CLOSURE SKIN 1/2X4 (GAUZE/BANDAGES/DRESSINGS) ×4 IMPLANT
SUT MNCRL AB 4-0 PS2 18 (SUTURE) ×2 IMPLANT
SUT STRATAFIX 0 PDS 27 VIOLET (SUTURE) ×1
SUT VIC AB 2-0 CT1 27 (SUTURE) ×3
SUT VIC AB 2-0 CT1 TAPERPNT 27 (SUTURE) ×6 IMPLANT
SUTURE STRATFX 0 PDS 27 VIOLET (SUTURE) ×2 IMPLANT
TIBIAL BASE ROT PLAT SZ 5 KNEE (Knees) ×1 IMPLANT
TRAY FOLEY MTR SLVR 16FR STAT (SET/KITS/TRAYS/PACK) ×2 IMPLANT
TUBE SUCTION HIGH CAP CLEAR NV (SUCTIONS) ×2 IMPLANT
WATER STERILE IRR 1000ML POUR (IV SOLUTION) ×4 IMPLANT
WRAP KNEE MAXI GEL POST OP (GAUZE/BANDAGES/DRESSINGS) ×2 IMPLANT

## 2023-01-25 NOTE — Discharge Instructions (Addendum)
Gabrielle Gross, MD Total Joint Specialist [facility] [address] [place], [place] [address] [phone]  TOTAL KNEE REPLACEMENT POSTOPERATIVE DIRECTIONS    Knee Rehabilitation, Guidelines Following Surgery  Results after knee surgery are often greatly improved when you follow the exercise, range of motion and muscle strengthening exercises prescribed by your doctor. Safety measures are also important to protect the knee from further injury. If any of these exercises cause you to have increased pain or swelling in your knee joint, decrease the amount until you are comfortable again and slowly increase them. If you have problems or questions, call your caregiver or physical therapist for advice.   BLOOD CLOT PREVENTION Take a 2.5 mg Eliquis twice a day for three weeks following surgery. Then discontinue. You may resume your vitamins/supplements once you have discontinued the Eliquis. Do not take any NSAIDs (Advil, Aleve, Ibuprofen, Meloxicam, etc.) until you have discontinued the Eliquis.   HOME CARE INSTRUCTIONS  Remove items at home which could result in a fall. This includes throw rugs or furniture in walking pathways.  ICE to the affected knee as much as tolerated. Icing helps control swelling. If the swelling is well controlled you will be more comfortable and rehab easier. Continue to use ice on the knee for pain and swelling from surgery. You may notice swelling that will progress down to the foot and ankle. This is normal after surgery. Elevate the leg when you are not up walking on it.    Continue to use the breathing machine which will help keep your temperature down. It is common for your temperature to cycle up and down following surgery, especially at night when you are not up moving around and exerting yourself. The breathing machine keeps your lungs expanded and your temperature down. Do not place pillow under the operative knee, focus on keeping the  knee straight while resting  DIET You may resume your previous home diet once you are discharged from the hospital.  DRESSING / WOUND CARE / SHOWERING Keep your bulky bandage on for 2 days. On the third post-operative day you may remove the Ace bandage and gauze. Cover the incision with gauze and paper tape. Change dressing daily or after showering.  You can shower 3 days after surgery. You do not need to cover the incision. Water and sudsy water can run over the incision, pat dry and apply new dressing afterwards.   ACTIVITY For the first 5 days, the key is rest and control of pain and swelling Do your home exercises twice a day starting on post-operative day 3. On the days you go to physical therapy, just do the home exercises once that day. You should rest, ice and elevate the leg for 50 minutes out of every hour. Get up and walk/stretch for 10 minutes per hour. After 5 days you can increase your activity slowly as tolerated. Walk with your walker as instructed. Use the walker until you are comfortable transitioning to a cane. Walk with the cane in the opposite hand of the operative leg. You may discontinue the cane once you are comfortable and walking steadily. Avoid periods of inactivity such as sitting longer than an hour when not asleep. This helps prevent blood clots.  You may discontinue the knee immobilizer once you are able to perform a straight leg raise while lying down. You may resume a sexual relationship in one month or when given the OK by your doctor.  You may return to work once  you are cleared by your doctor.  Do not drive a car for 6 weeks or until released by your surgeon.  Do not drive while taking narcotics.  TED HOSE STOCKINGS Wear the elastic stockings on both legs for three weeks following surgery during the day. You may remove them at night for sleeping.  WEIGHT BEARING Weight bearing as tolerated with assist device (walker, cane, etc) as directed, use it as long  as suggested by your surgeon or therapist, typically at least 4-6 weeks.  POSTOPERATIVE CONSTIPATION PROTOCOL Constipation - defined medically as fewer than three stools per week and severe constipation as less than one stool per week.  One of the most common issues patients have following surgery is constipation.  Even if you have a regular bowel pattern at home, your normal regimen is likely to be disrupted due to multiple reasons following surgery.  Combination of anesthesia, postoperative narcotics, change in appetite and fluid intake all can affect your bowels.  In order to avoid complications following surgery, here are some recommendations in order to help you during your recovery period.  Colace (docusate) - Pick up an over-the-counter form of Colace or another stool softener and take twice a day as long as you are requiring postoperative pain medications.  Take with a full glass of water daily.  If you experience loose stools or diarrhea, hold the colace until you stool forms back up. If your symptoms do not get better within 1 week or if they get worse, check with your doctor. Dulcolax (bisacodyl) - Pick up over-the-counter and take as directed by the product packaging as needed to assist with the movement of your bowels.  Take with a full glass of water.  Use this product as needed if not relieved by Colace only.  MiraLax (polyethylene glycol) - Pick up over-the-counter to have on hand. MiraLax is a solution that will increase the amount of water in your bowels to assist with bowel movements.  Take as directed and can mix with a glass of water, juice, soda, coffee, or tea. Take if you go more than two days without a movement. Do not use MiraLax more than once per day. Call your doctor if you are still constipated or irregular after using this medication for 7 days in a row.  If you continue to have problems with postoperative constipation, please contact the office for further assistance and  recommendations.  If you experience "the worst abdominal pain ever" or develop nausea or vomiting, please contact the office immediatly for further recommendations for treatment.  ITCHING If you experience itching with your medications, try taking only a single pain pill, or even half a pain pill at a time.  You can also use Benadryl over the counter for itching or also to help with sleep.   MEDICATIONS See your medication summary on the "After Visit Summary" that the nursing staff will review with you prior to discharge.  You may have some home medications which will be placed on hold until you complete the course of blood thinner medication.  It is important for you to complete the blood thinner medication as prescribed by your surgeon.  Continue your approved medications as instructed at time of discharge.  PRECAUTIONS If you experience chest pain or shortness of breath - call 911 immediately for transfer to the hospital emergency department.  If you develop a fever greater that 101 F, purulent drainage from wound, increased redness or drainage from wound, foul odor from the wound/dressing,  or calf pain - CONTACT YOUR SURGEON.                                                   FOLLOW-UP APPOINTMENTS Make sure you keep all of your appointments after your operation with your surgeon and caregivers. You should call the office at the above phone number and make an appointment for approximately two weeks after the date of your surgery or on the date instructed by your surgeon outlined in the "After Visit Summary".  RANGE OF MOTION AND STRENGTHENING EXERCISES  Rehabilitation of the knee is important following a knee injury or an operation. After just a few days of immobilization, the muscles of the thigh which control the knee become weakened and shrink (atrophy). Knee exercises are designed to build up the tone and strength of the thigh muscles and to improve knee motion. Often times heat used for  twenty to thirty minutes before working out will loosen up your tissues and help with improving the range of motion but do not use heat for the first two weeks following surgery. These exercises can be done on a training (exercise) mat, on the floor, on a table or on a bed. Use what ever works the best and is most comfortable for you Knee exercises include:  Leg Lifts - While your knee is still immobilized in a splint or cast, you can do straight leg raises. Lift the leg to 60 degrees, hold for 3 sec, and slowly lower the leg. Repeat 10-20 times 2-3 times daily. Perform this exercise against resistance later as your knee gets better.  Quad and Hamstring Sets - Tighten up the muscle on the front of the thigh (Quad) and hold for 5-10 sec. Repeat this 10-20 times hourly. Hamstring sets are done by pushing the foot backward against an object and holding for 5-10 sec. Repeat as with quad sets.  Leg Slides: Lying on your back, slowly slide your foot toward your buttocks, bending your knee up off the floor (only go as far as is comfortable). Then slowly slide your foot back down until your leg is flat on the floor again. Angel Wings: Lying on your back spread your legs to the side as far apart as you can without causing discomfort.  A rehabilitation program following serious knee injuries can speed recovery and prevent re-injury in the future due to weakened muscles. Contact your doctor or a physical therapist for more information on knee rehabilitation.   POST-OPERATIVE OPIOID TAPER INSTRUCTIONS: It is important to wean off of your opioid medication as soon as possible. If you do not need pain medication after your surgery it is ok to stop day one. Opioids include: Codeine, Hydrocodone(Norco, Vicodin), Oxycodone(Percocet, oxycontin) and hydromorphone amongst others.  Long term and even short term use of opiods can cause: Increased pain response Dependence Constipation Depression Respiratory depression And  more.  Withdrawal symptoms can include Flu like symptoms Nausea, vomiting And more Techniques to manage these symptoms Hydrate well Eat regular healthy meals Stay active Use relaxation techniques(deep breathing, meditating, yoga) Do Not substitute Alcohol to help with tapering If you have been on opioids for less than two weeks and do not have pain than it is ok to stop all together.  Plan to wean off of opioids This plan should start within one week post op of   your joint replacement. Maintain the same interval or time between taking each dose and first decrease the dose.  Cut the total daily intake of opioids by one tablet each day Next start to increase the time between doses. The last dose that should be eliminated is the evening dose.   IF YOU ARE TRANSFERRED TO A SKILLED REHAB FACILITY If the patient is transferred to a skilled rehab facility following release from the hospital, a list of the current medications will be sent to the facility for the patient to continue.  When discharged from the skilled rehab facility, please have the facility set up the patient's [place] prior to being released. Also, the skilled facility will be responsible for providing the patient with their medications at time of release from the facility to include their pain medication, the muscle relaxants, and their blood thinner medication. If the patient is still at the rehab facility at time of the two week follow up appointment, the skilled rehab facility will also need to assist the patient in arranging follow up appointment in our office and any transportation needs.  MAKE SURE YOU:  Understand these instructions.  Get help right away if you are not doing well or get worse.   DENTAL ANTIBIOTICS:  In most cases prophylactic antibiotics for Dental procdeures after total joint surgery are not necessary.  Exceptions are as follows:  1. History of prior total joint infection  2.  Severely immunocompromised (Organ Transplant, cancer chemotherapy, Rheumatoid biologic meds such as [place])  3. Poorly controlled diabetes (A1C &gt; 8.0, blood glucose over 200)  If you have one of these conditions, contact your surgeon for an antibiotic prescription, prior to your dental procedure.    Pick up stool softner and laxative for home use following surgery while on pain medications. Do not submerge incision under water. Please use good hand washing techniques while changing dressing each day. May shower starting three days after surgery. Please use a clean towel to pat the incision dry following showers. Continue to use ice for pain and swelling after surgery. Do not use any lotions or creams on the incision until instructed by your surgeon.

## 2023-01-25 NOTE — Plan of Care (Signed)
  Problem: Activity: Goal: Ability to avoid complications of mobility impairment will improve Outcome: Progressing Goal: Range of joint motion will improve Outcome: Progressing   Problem: Pain Managment: Goal: General experience of comfort will improve Outcome: Progressing   Problem: Safety: Goal: Ability to remain free from injury will improve Outcome: Progressing   

## 2023-01-25 NOTE — Anesthesia Procedure Notes (Signed)
Procedure Name: MAC Date/Time: 01/25/2023 10:18 AM  Performed by: Orest Dikes, CRNAPre-anesthesia Checklist: Patient identified, Emergency Drugs available, Suction available and Patient being monitored Oxygen Delivery Method: Simple face mask

## 2023-01-25 NOTE — Anesthesia Preprocedure Evaluation (Signed)
Anesthesia Evaluation  Patient identified by MRN, date of birth, ID band Patient awake    Reviewed: Allergy & Precautions, NPO status , Patient's Chart, lab work & pertinent test results  History of Anesthesia Complications Negative for: history of anesthetic complications  Airway Mallampati: III  TM Distance: >3 FB Neck ROM: Full    Dental  (+) Edentulous Upper, Edentulous Lower   Pulmonary neg shortness of breath, asthma , neg recent URI, neg PE   breath sounds clear to auscultation       Cardiovascular hypertension, Pt. on medications (-) angina (-) Past MI and (-) CHF  Rhythm:Regular     Neuro/Psych negative neurological ROS  negative psych ROS   GI/Hepatic negative GI ROS, Neg liver ROS,,,  Endo/Other  Hypothyroidism    Renal/GU negative Renal ROS     Musculoskeletal  (+) Arthritis ,    Abdominal   Peds  Hematology negative hematology ROS (+) Lab Results      Component                Value               Date                      WBC                      4.3                 01/12/2023                HGB                      12.6                01/12/2023                HCT                      39.2                01/12/2023                MCV                      94.9                01/12/2023                PLT                      187                 01/12/2023              Anesthesia Other Findings   Reproductive/Obstetrics                             Anesthesia Physical Anesthesia Plan  ASA: 2  Anesthesia Plan: MAC, Regional and Spinal   Post-op Pain Management: Regional block* and Ofirmev IV (intra-op)*   Induction: Intravenous  PONV Risk Score and Plan: 2 and Propofol infusion and Treatment may vary due to age or medical condition  Airway Management Planned: Nasal Cannula and Natural Airway  Additional Equipment: None  Intra-op Plan:   Post-operative Plan:    Informed Consent: I  have reviewed the patients History and Physical, chart, labs and discussed the procedure including the risks, benefits and alternatives for the proposed anesthesia with the patient or authorized representative who has indicated his/her understanding and acceptance.     Dental advisory given  Plan Discussed with: CRNA  Anesthesia Plan Comments:        Anesthesia Quick Evaluation

## 2023-01-25 NOTE — Anesthesia Procedure Notes (Signed)
Spinal  Patient location during procedure: OR Start time: 01/25/2023 10:19 AM End time: 01/25/2023 10:24 AM Reason for block: surgical anesthesia Staffing Performed: anesthesiologist  Anesthesiologist: Val Eagle, MD Performed by: Val Eagle, MD Authorized by: Val Eagle, MD   Preanesthetic Checklist Completed: patient identified, IV checked, risks and benefits discussed, surgical consent, monitors and equipment checked, pre-op evaluation and timeout performed Spinal Block Patient position: sitting Prep: DuraPrep Patient monitoring: heart rate, cardiac monitor, continuous pulse ox and blood pressure Approach: midline Location: L5-S1 Injection technique: single-shot Needle Needle type: Pencan  Needle gauge: 24 G Needle length: 9 cm Assessment Sensory level: T4 Events: CSF return Additional Notes NaCl used for local infiltration given lidocaine concern by patient. Exparel used on last knee operation without symptoms previously reported with lidocaine

## 2023-01-25 NOTE — Evaluation (Signed)
Physical Therapy Evaluation Patient Details Name: Gabrielle [name] MRN: [id number] DOB: 19-Feb-1946 Today's Date: 01/25/2023  History of Present Illness  Pt is a 77yo female s/p L TKA on 01/25/23. PMH: s/p R-TKA on 10/26/22. PMH: HTN, OSA, glaucoma, hypothyroidism.  Clinical Impression  Pt is s/p TKA resulting in the deficits listed below (see PT Problem List).  Pt amb ~ 25' with RW and min assist. HEP initiated. Anticipate steady progress  Pt will benefit from acute skilled PT to increase their independence and safety with mobility to allow discharge.         Recommendations for follow up therapy are one component of a multi-disciplinary discharge planning process, led by the attending physician.  Recommendations may be updated based on patient status, additional functional criteria and insurance authorization.  Follow Up Recommendations       Assistance Recommended at Discharge    Patient can return home with the following  A little help with walking and/or transfers;Assist for transportation;Help with stairs or ramp for entrance;Assistance with cooking/housework    Equipment Recommendations None recommended by PT  Recommendations for Other Services       Functional Status Assessment Patient has had a recent decline in their functional status and demonstrates the ability to make significant improvements in function in a reasonable and predictable amount of time.     Precautions / Restrictions Precautions Precautions: Fall;Knee Required Braces or Orthoses: Knee Immobilizer - Left Knee Immobilizer - Left: Discontinue once straight leg raise with < 10 degree lag Restrictions Weight Bearing Restrictions: No Other Position/Activity Restrictions: WBAT      Mobility  Bed Mobility Overal bed mobility: Needs Assistance Bed Mobility: Supine to Sit     Supine to sit: Min assist     General bed mobility comments: assist to progress RLE off bed    Transfers Overall transfer  level: Needs assistance Equipment used: Rolling walker (2 wheels) Transfers: Sit to/from Stand Sit to Stand: Min assist           General transfer comment: cues for hand placement and LLE Position    Ambulation/Gait Ambulation/Gait assistance: Min assist Gait Distance (Feet): 25 Feet Assistive device: Rolling walker (2 wheels) Gait Pattern/deviations: Step-to pattern, Decreased stance time - left       General Gait Details: cues for sequence and RW distance from self  Stairs            Wheelchair Mobility    Modified Rankin (Stroke Patients Only)       Balance                                             Pertinent Vitals/Pain Pain Assessment Pain Assessment: 0-10 Pain Score: 5  Pain Location: L knee Pain Descriptors / Indicators: Grimacing, Sore Pain Intervention(s): Limited activity within patient's tolerance, Monitored during session, Premedicated before session, Repositioned    Home Living Family/patient expects to be discharged to:: Private residence Living Arrangements: Children Available Help at Discharge: Family;Available 24 hours/day   Home Access: Stairs to enter Entrance Stairs-Rails: None Entrance Stairs-Number of Steps: 1 Alternate Level Stairs-Number of Steps: 14 Home Layout: Two level Home Equipment: Agricultural consultant (2 wheels);BSC/3in1;Cane - single point Additional Comments: Son reports they have it setup so pt can stay downstairs.    Prior Function Prior Level of Function : Independent/Modified Independent  Mobility Comments: IND ADLs Comments: IND     Hand Dominance        Extremity/Trunk Assessment   Upper Extremity Assessment Upper Extremity Assessment: Overall WFL for tasks assessed    Lower Extremity Assessment Lower Extremity Assessment: LLE deficits/detail LLE Deficits / Details: ankle WFL, knee extension and hip flexion grossly 2+/5       Communication      Cognition  Arousal/Alertness: Awake/alert Behavior During Therapy: WFL for tasks assessed/performed Overall Cognitive Status: Within Functional Limits for tasks assessed                                          General Comments      Exercises Total Joint Exercises Ankle Circles/Pumps: AROM, Both, 10 reps   Assessment/Plan    PT Assessment Patient needs continued PT services  PT Problem List Decreased strength;Decreased range of motion;Decreased activity tolerance;Decreased balance;Decreased mobility;Pain;Decreased knowledge of precautions;Decreased knowledge of use of DME       PT Treatment Interventions DME instruction;Gait training;Functional mobility training;Therapeutic activities;Patient/family education;Stair training;Therapeutic exercise    PT Goals (Current goals can be found in the Care Plan section)  Acute Rehab PT Goals Patient Stated Goal: no knee pain PT Goal Formulation: With patient Time For Goal Achievement: 02/01/23 Potential to Achieve Goals: Good    Frequency 7X/week     Co-evaluation               AM-PAC PT "6 Clicks" Mobility  Outcome Measure Help needed turning from your back to your side while in a flat bed without using bedrails?: A Little Help needed moving from lying on your back to sitting on the side of a flat bed without using bedrails?: A Little Help needed moving to and from a bed to a chair (including a wheelchair)?: A Little Help needed standing up from a chair using your arms (e.g., wheelchair or bedside chair)?: A Little Help needed to walk in hospital room?: A Little Help needed climbing 3-5 steps with a railing? : A Lot 6 Click Score: 17    End of Session Equipment Utilized During Treatment: Gait belt Activity Tolerance: Patient tolerated treatment well Patient left: in chair;with call bell/phone within reach;with chair alarm set Nurse Communication: Mobility status PT Visit Diagnosis: Other abnormalities of gait and  mobility (R26.89);Difficulty in walking, not elsewhere classified (R26.2)    Time: 2130-8657 PT Time Calculation (min) (ACUTE ONLY): 19 min   Charges:   PT Evaluation $PT Eval Low Complexity: 1 Low          Aryahna Spagna, PT  Acute Rehab Dept [facility]) [phone]  01/25/2023   [facility] 01/25/2023, 4:53 PM

## 2023-01-25 NOTE — Op Note (Signed)
OPERATIVE REPORT-TOTAL KNEE ARTHROPLASTY   Pre-operative diagnosis- Osteoarthritis  Left knee(s)  Post-operative diagnosis- Osteoarthritis Left knee(s)  Procedure-  Left  Total Knee Arthroplasty  Surgeon- Gus Rankin. Braden Deloach, MD  Assistant- Arther Abbott, PA-C   Anesthesia-   Adductor canal block and spinal  EBL-25 mL   Drains None  Tourniquet time-  Total Tourniquet Time Documented: Thigh (Left) - 33 minutes Total: Thigh (Left) - 33 minutes     Complications- None  Condition-PACU - hemodynamically stable.   Brief Clinical Note  Gabrielle [name] is a 77 y.o. year old female with end stage OA of her left knee with progressively worsening pain and dysfunction. She has constant pain, with activity and at rest and significant functional deficits with difficulties even with ADLs. She has had extensive non-op management including analgesics, injections of cortisone and viscosupplements, and home exercise program, but remains in significant pain with significant dysfunction. Radiographs show bone on bone arthritis lateral and patellofemoral. She presents now for left Total Knee Arthroplasty.     Procedure in detail---   The patient is brought into the operating room and positioned supine on the operating table. After successful administration of  Adductor canal block and spinal,   a tourniquet is placed high on the  Left thigh(s) and the lower extremity is prepped and draped in the usual sterile fashion. Time out is performed by the operating team and then the  Left lower extremity is wrapped in Esmarch, knee flexed and the tourniquet inflated to 300 mmHg.       A midline incision is made with a ten blade through the subcutaneous tissue to the level of the extensor mechanism. A fresh blade is used to make a medial parapatellar arthrotomy. Soft tissue over the proximal medial tibia is subperiosteally elevated to the joint line with a knife and into the semimembranosus bursa with a Cobb  elevator. Soft tissue over the proximal lateral tibia is elevated with attention being paid to avoiding the patellar tendon on the tibial tubercle. The patella is everted, knee flexed 90 degrees and the ACL and PCL are removed. Findings are bone on bone lateral and patellofemoral with large global osteophytes        The drill is used to create a starting hole in the distal femur and the canal is thoroughly irrigated with sterile saline to remove the fatty contents. The 5 degree Left  valgus alignment guide is placed into the femoral canal and the distal femoral cutting block is pinned to remove 9 mm off the distal femur. Resection is made with an oscillating saw.      The tibia is subluxed forward and the menisci are removed. The extramedullary alignment guide is placed referencing proximally at the medial aspect of the tibial tubercle and distally along the second metatarsal axis and tibial crest. The block is pinned to remove 2mm off the more deficient lateral  side. Resection is made with an oscillating saw. Size 5is the most appropriate size for the tibia and the proximal tibia is prepared with the modular drill and keel punch for that size.      The femoral sizing guide is placed and size 5 is most appropriate. Rotation is marked off the epicondylar axis and confirmed by creating a rectangular flexion gap at 90 degrees. The size 5 cutting block is pinned in this rotation and the anterior, posterior and chamfer cuts are made with the oscillating saw. The intercondylar block is then placed and that cut is  made.      Trial size 5 tibial component, trial size 5 posterior stabilized femur and a 7  mm posterior stabilized rotating platform insert trial is placed. Full extension is achieved with excellent varus/valgus and anterior/posterior balance throughout full range of motion. The patella is everted and thickness measured to be 21  mm. Free hand resection is taken to 12 mm, a 35 template is placed, lug holes  are drilled, trial patella is placed, and it tracks normally. Osteophytes are removed off the posterior femur with the trial in place. All trials are removed and the cut bone surfaces prepared with pulsatile lavage. Cement is mixed and once ready for implantation, the size 5 tibial implant, size  5 posterior stabilized femoral component, and the size 35 patella are cemented in place and the patella is held with the clamp. The trial insert is placed and the knee held in full extension. The Exparel (20 ml mixed with 60 ml saline) is injected into the extensor mechanism, posterior capsule, medial and lateral gutters and subcutaneous tissues.  All extruded cement is removed and once the cement is hard the permanent 7 mm posterior stabilized rotating platform insert is placed into the tibial tray.      The wound is copiously irrigated with saline solution and the extensor mechanism closed with # 0 Stratofix suture. The tourniquet is released for a total tourniquet time of 33  minutes. Flexion against gravity is 140 degrees and the patella tracks normally. Subcutaneous tissue is closed with 2.0 vicryl and subcuticular with running 4.0 Monocryl. The incision is cleaned and dried and steri-strips and a bulky sterile dressing are applied. The limb is placed into a knee immobilizer and the patient is awakened and transported to recovery in stable condition.      Please note that a surgical assistant was a medical necessity for this procedure in order to perform it in a safe and expeditious manner. Surgical assistant was necessary to retract the ligaments and vital neurovascular structures to prevent injury to them and also necessary for proper positioning of the limb to allow for anatomic placement of the prosthesis.   Gus Rankin Jachob Mcclean, MD    01/25/2023, 11:23 AM

## 2023-01-25 NOTE — Interval H&P Note (Signed)
History and Physical Interval Note:  01/25/2023 8:28 AM  Gabrielle [name]  has presented today for surgery, with the diagnosis of LEFT  knee osteoarthritis.  The various methods of treatment have been discussed with the patient and family. After consideration of risks, benefits and other options for treatment, the patient has consented to  Procedure(s): TOTAL KNEE ARTHROPLASTY (Left) as a surgical intervention.  The patient's history has been reviewed, patient examined, no change in status, stable for surgery.  I have reviewed the patient's chart and labs.  Questions were answered to the patient's satisfaction.     Homero Fellers Kineta Fudala

## 2023-01-25 NOTE — Progress Notes (Signed)
Orthopedic Tech Progress Note Patient Details:  Gabrielle [name] August 30, 1946 [id number]  CPM Left Knee CPM Left Knee: On Left Knee Flexion (Degrees): 40 Left Knee Extension (Degrees): 10  Post Interventions Patient Tolerated: Well  Darleen Crocker 01/25/2023, 12:02 PM

## 2023-01-25 NOTE — Anesthesia Postprocedure Evaluation (Signed)
Anesthesia Post Note  Patient: Gabrielle [name]  Procedure(s) Performed: TOTAL KNEE ARTHROPLASTY (Left: Knee)     Patient location during evaluation: PACU Anesthesia Type: Regional, MAC and Spinal Level of consciousness: awake and alert Pain management: pain level controlled Vital Signs Assessment: post-procedure vital signs reviewed and stable Respiratory status: spontaneous breathing, nonlabored ventilation, respiratory function stable and patient connected to nasal cannula oxygen Cardiovascular status: stable and blood pressure returned to baseline Postop Assessment: no apparent nausea or vomiting Anesthetic complications: no   No notable events documented.  Last Vitals:  Vitals:   01/25/23 1620 01/25/23 1655  BP: 107/62 (!) 107/52  Pulse: (!) 57 (!) 59  Resp: 18 18  Temp: 36.6 C (!) 36.4 C  SpO2: 100% 99%    Last Pain:  Vitals:   01/25/23 1811  TempSrc:   PainSc: 7                  Winner Valeriano

## 2023-01-25 NOTE — Care Plan (Signed)
Ortho Bundle Case Management Note  Patient Details  Name: Gabrielle [name] MRN: [id number] Date of Birth: 21-Sep-1946                  R TKA on 10/26/22.  DCP: Home with son.  DME: No needs. Has RW.  PT: EO    DME Arranged:  N/A DME Agency:       Additional Comments: Please contact me with any questions of if this plan should need to change.    Despina Pole, Case Manager  [facility]  [phone] 01/25/2023, 1:50 PM

## 2023-01-25 NOTE — Progress Notes (Signed)
Orthopedic Tech Progress Note Patient Details:  Gabrielle [name] 07/31/1946 [id number]  CPM Left Knee CPM Left Knee: Off Left Knee Flexion (Degrees): 40 Left Knee Extension (Degrees): 10  Post Interventions Patient Tolerated: Well  Darleen Crocker 01/25/2023, 4:11 PM

## 2023-01-25 NOTE — Transfer of Care (Signed)
Immediate Anesthesia Transfer of Care Note  Patient: Gabrielle [name]  Procedure(s) Performed: TOTAL KNEE ARTHROPLASTY (Left: Knee)  Patient Location: PACU  Anesthesia Type:Spinal  Level of Consciousness: awake, alert , and oriented  Airway & Oxygen Therapy: Patient Spontanous Breathing and Patient connected to face mask oxygen  Post-op Assessment: Report given to RN and Post -op Vital signs reviewed and stable  Post vital signs: Reviewed and stable  Last Vitals:  Vitals Value Taken Time  BP 123/65 01/25/23 1146  Temp    Pulse 63 01/25/23 1147  Resp 17 01/25/23 1147  SpO2 100 % 01/25/23 1147  Vitals shown include unvalidated device data.  Last Pain:  Vitals:   01/25/23 1015  TempSrc:   PainSc: 0-No pain         Complications: No notable events documented.

## 2023-01-25 NOTE — Anesthesia Procedure Notes (Signed)
Anesthesia Regional Block: Adductor canal block   Pre-Anesthetic Checklist: , timeout performed,  Correct Patient, Correct Site, Correct Laterality,  Correct Procedure, Correct Position, site marked,  Risks and benefits discussed,  Surgical consent,  Pre-op evaluation,  At surgeon's request and post-op pain management  Laterality: Left and Lower  Prep: chloraprep       Needles:  Injection technique: Single-shot      Needle Length: 9cm  Needle Gauge: 22     Additional Needles: Arrow StimuQuik ECHO Echogenic Stimulating PNB Needle  Procedures:,,,, ultrasound used (permanent image in chart),,    Narrative:  Start time: 01/25/2023 10:04 AM End time: 01/25/2023 10:09 AM Injection made incrementally with aspirations every 5 mL.  Performed by: Personally  Anesthesiologist: Val Eagle, MD

## 2023-01-26 ENCOUNTER — Encounter (HOSPITAL_COMMUNITY): Payer: Self-pay | Admitting: Orthopedic Surgery

## 2023-01-26 DIAGNOSIS — Z96651 Presence of right artificial knee joint: Secondary | ICD-10-CM | POA: Diagnosis not present

## 2023-01-26 DIAGNOSIS — I10 Essential (primary) hypertension: Secondary | ICD-10-CM | POA: Diagnosis not present

## 2023-01-26 DIAGNOSIS — Z79899 Other long term (current) drug therapy: Secondary | ICD-10-CM | POA: Diagnosis not present

## 2023-01-26 DIAGNOSIS — M1712 Unilateral primary osteoarthritis, left knee: Secondary | ICD-10-CM | POA: Diagnosis not present

## 2023-01-26 DIAGNOSIS — Z91040 Latex allergy status: Secondary | ICD-10-CM | POA: Diagnosis not present

## 2023-01-26 DIAGNOSIS — J45909 Unspecified asthma, uncomplicated: Secondary | ICD-10-CM | POA: Diagnosis not present

## 2023-01-26 DIAGNOSIS — E039 Hypothyroidism, unspecified: Secondary | ICD-10-CM | POA: Diagnosis not present

## 2023-01-26 LAB — BASIC METABOLIC PANEL
Anion gap: 8 (ref 5–15)
BUN: 10 mg/dL (ref 8–23)
CO2: 23 mmol/L (ref 22–32)
Calcium: 10 mg/dL (ref 8.9–10.3)
Chloride: 107 mmol/L (ref 98–111)
Creatinine, Ser: 0.68 mg/dL (ref 0.44–1.00)
GFR, Estimated: >60 mL/min (ref >60)
Glucose, Bld: 123 mg/dL — ABNORMAL HIGH (ref 70–99)
Potassium: 3.7 mmol/L (ref 3.5–5.1)
Sodium: 138 mmol/L (ref 135–145)

## 2023-01-26 LAB — CBC
HCT: 34.6 % — ABNORMAL LOW (ref 36.0–46.0)
Hemoglobin: 11.2 g/dL — ABNORMAL LOW (ref 12.0–15.0)
MCH: 30 pg (ref 26.0–34.0)
MCHC: 32.4 g/dL (ref 30.0–36.0)
MCV: 92.8 fL (ref 80.0–100.0)
Platelets: 170 10*3/uL (ref 150–400)
RBC: 3.73 MIL/uL — ABNORMAL LOW (ref 3.87–5.11)
RDW: 12.8 % (ref 11.5–15.5)
WBC: 7.8 10*3/uL (ref 4.0–10.5)
nRBC: 0 % (ref 0.0–0.2)

## 2023-01-26 MED ORDER — METHOCARBAMOL 500 MG PO TABS: 500.0000 mg | ORAL_TABLET | Freq: Four times a day (QID) | ORAL | 0 refills | Status: DC | PRN

## 2023-01-26 MED ORDER — TRAMADOL HCL 50 MG PO TABS: 50.0000 mg | ORAL_TABLET | Freq: Four times a day (QID) | ORAL | 0 refills | Status: DC | PRN

## 2023-01-26 MED ORDER — RIVAROXABAN 10 MG PO TABS: 10.0000 mg | ORAL_TABLET | Freq: Every day | ORAL | 0 refills | Status: DC

## 2023-01-26 MED ORDER — HYDROCODONE-ACETAMINOPHEN 5-325 MG PO TABS: 1.0000 | ORAL_TABLET | Freq: Four times a day (QID) | ORAL | 0 refills | Status: DC | PRN

## 2023-01-26 NOTE — Progress Notes (Signed)
Physical Therapy Treatment Patient Details Name: Gabrielle [name] MRN: [id number] DOB: 1945-11-13 Today's Date: 01/26/2023   History of Present Illness Pt is a 77yo female s/p L TKA on 01/25/23. PMH: s/p R-TKA on 10/26/22. PMH: HTN, OSA, glaucoma, hypothyroidism.    PT Comments    Pt progressing, incr gait distance however continues to require steadying assist and fatigues easily. Pt remains concerned about not having 24hr assist at home, will benefit from another day to incr independence and safety    Recommendations for follow up therapy are one component of a multi-disciplinary discharge planning process, led by the attending physician.  Recommendations may be updated based on patient status, additional functional criteria and insurance authorization.  Follow Up Recommendations       Assistance Recommended at Discharge Frequent or constant Supervision/Assistance  Patient can return home with the following A little help with walking and/or transfers;Assist for transportation;Help with stairs or ramp for entrance;Assistance with cooking/housework   Equipment Recommendations  None recommended by PT    Recommendations for Other Services       Precautions / Restrictions Precautions Precautions: Fall;Knee Restrictions Weight Bearing Restrictions: No Other Position/Activity Restrictions: WBAT     Mobility  Bed Mobility Overal bed mobility: Needs Assistance Bed Mobility: Sit to Supine, Supine to Sit     Supine to sit: Supervision, Min guard Sit to supine: Min assist   General bed mobility comments: incr time and effort. cues to self assist with gait belt as leg lifter    Transfers Overall transfer level: Needs assistance Equipment used: Rolling walker (2 wheels) Transfers: Sit to/from Stand Sit to Stand: Min assist           General transfer comment: cues for hand placement and LLE Position. assist to rise and steady    Ambulation/Gait Ambulation/Gait assistance:  Min assist Gait Distance (Feet): 60 Feet Assistive device: Rolling walker (2 wheels) Gait Pattern/deviations: Step-to pattern, Decreased stance time - left       General Gait Details: cues for sequence and RW distance from self; assist to steady while wt shifting; improving wt shift to LL E   Stairs Stairs: Yes Stairs assistance: Min assist Stair Management: Two rails, Forwards, Step to pattern, No rails Number of Stairs: 3 General stair comments: last step with RW to simulate home; 2 steps with rails and min assist. very unsteady with up/down step. repetitious cues for sequence   Wheelchair Mobility    Modified Rankin (Stroke Patients Only)       Balance                                            Cognition Arousal/Alertness: Awake/alert Behavior During Therapy: WFL for tasks assessed/performed Overall Cognitive Status: Within Functional Limits for tasks assessed                                          Exercises Total Joint Exercises Ankle Circles/Pumps: AROM, Both, 10 reps Quad Sets: AROM, Both, 10 reps Heel Slides: AAROM, Left, 10 reps Hip ABduction/ADduction: AROM, AAROM, Left, 10 reps Straight Leg Raises: AAROM, Left, 5 reps, AROM (10 degree lag)    General Comments        Pertinent Vitals/Pain Pain Assessment Pain Assessment: 0-10 Pain Score: 5  Pain Location:  L knee Pain Descriptors / Indicators: Grimacing, Sore Pain Intervention(s): Limited activity within patient's tolerance, Monitored during session, Premedicated before session, Repositioned, Ice applied    Home Living                          Prior Function            PT Goals (current goals can now be found in the care plan section) Acute Rehab PT Goals Patient Stated Goal: no knee pain PT Goal Formulation: With patient Time For Goal Achievement: 02/01/23 Potential to Achieve Goals: Good Progress towards PT goals: Progressing toward goals     Frequency    7X/week      PT Plan Current plan remains appropriate    Co-evaluation              AM-PAC PT "6 Clicks" Mobility   Outcome Measure  Help needed turning from your back to your side while in a flat bed without using bedrails?: A Little Help needed moving from lying on your back to sitting on the side of a flat bed without using bedrails?: A Little Help needed moving to and from a bed to a chair (including a wheelchair)?: A Little Help needed standing up from a chair using your arms (e.g., wheelchair or bedside chair)?: A Little Help needed to walk in hospital room?: A Little Help needed climbing 3-5 steps with a railing? : A Little 6 Click Score: 18    End of Session Equipment Utilized During Treatment: Gait belt Activity Tolerance: Patient tolerated treatment well Patient left: in bed;with bed alarm set;with call bell/phone within reach Nurse Communication: Mobility status PT Visit Diagnosis: Other abnormalities of gait and mobility (R26.89);Difficulty in walking, not elsewhere classified (R26.2)     Time: 1610-9604 PT Time Calculation (min) (ACUTE ONLY): 29 min  Charges:  $Gait Training: 23-37 mins                     Keliah Harned, PT  Acute Rehab Dept ([facility]) [phone]  01/26/2023    [facility] 01/26/2023, 2:23 PM

## 2023-01-26 NOTE — Progress Notes (Signed)
   Subjective: 1 Day Post-Op Procedure(s) (LRB): TOTAL KNEE ARTHROPLASTY (Left) Patient reports pain as mild.   Patient seen in rounds by Dr. Lequita Halt. Patient is well, and has had no acute complaints or problems No issues overnight. Denies chest pain, SOB, or calf pain. Foley catheter removed this AM.  We will continue therapy today, ambulated 25' yesterday.   Objective: Vital signs in last 24 hours: Temp:  [95 F (35 C)-97.9 F (36.6 C)] 97.8 F (36.6 C) (04/23 0603) Pulse Rate:  [50-75] 66 (04/23 0603) Resp:  [12-20] 17 (04/23 0603) BP: (107-153)/(52-86) 139/72 (04/23 0603) SpO2:  [93 %-100 %] 94 % (04/23 0603) Weight:  [66 kg] 66 kg (04/22 0837)  Intake/Output from previous day:  Intake/Output Summary (Last 24 hours) at 01/26/2023 0805 Last data filed at 01/26/2023 0600 Gross per 24 hour  Intake 2931.33 ml  Output 3675 ml  Net -743.67 ml     Intake/Output this shift: No intake/output data recorded.  Labs: Recent Labs    01/26/23 0424  HGB 11.2*   Recent Labs    01/26/23 0424  WBC 7.8  RBC 3.73*  HCT 34.6*  PLT 170   Recent Labs    01/26/23 0424  NA 138  K 3.7  CL 107  CO2 23  BUN 10  CREATININE 0.68  GLUCOSE 123*  CALCIUM 10.0   No results for input(s): "LABPT", "INR" in the last 72 hours.  Exam: General - Patient is Alert and Oriented Extremity - Neurologically intact Neurovascular intact Sensation intact distally Dorsiflexion/Plantar flexion intact Dressing - dressing C/D/I Motor Function - intact, moving foot and toes well on exam.   Past Medical History:  Diagnosis Date   Arthritis    Asthma    childhood   Glaucoma    Hypertension    Hypothyroidism    Sleep apnea    not used in 7-8 years per pt   Thyroid disease     Assessment/Plan: 1 Day Post-Op Procedure(s) (LRB): TOTAL KNEE ARTHROPLASTY (Left) Principal Problem:   OA (osteoarthritis) of knee Active Problems:   Primary osteoarthritis of left knee  Estimated body mass  index is 24.21 kg/m as calculated from the following:   Height as of this encounter:  (1.651 m).   Weight as of this encounter: 66 kg. Advance diet Up with therapy D/C IV fluids   Patient's anticipated LOS is less than 2 midnights, meeting these requirements: - Lives within 1 hour of care - Has a competent adult at home to recover with post-op recover - NO history of  - Chronic pain requiring opioids  - Diabetes  - Coronary Artery Disease  - Heart failure  - Heart attack  - Stroke  - DVT/VTE  - Cardiac arrhythmia  - Respiratory Failure/COPD  - Renal failure  - Anemia  - Advanced Liver disease  DVT Prophylaxis - Xarelto Weight bearing as tolerated. Continue therapy.  Plan is to go Home after hospital stay. Plan for discharge later today if progresses with therapy and meeting goals. Scheduled for OPPT at [facility]. Follow-up in the office in 2 weeks.  The PDMP database was reviewed today prior to any opioid medications being prescribed to this patient.  Arther Abbott, PA-C Orthopedic Surgery [phone] 01/26/2023, 8:05 AM

## 2023-01-26 NOTE — TOC Transition Note (Signed)
Transition of Care [facility]) - CM/SW Discharge Note  Patient Details  Name: Gabrielle [name] MRN: [id number] Date of Birth: December 01, 1945  Transition of Care [facility]) CM/SW Contact:  Ewing Schlein, LCSW Phone Number: 01/26/2023, 1:20 PM  Clinical Narrative: Patient is expected to discharge home after working with PT. CSW met with patient to confirm discharge plan. Patient will go home with OPPT at [facility]. Patient has a rolling walker and cane at home, so there are no DME needs at this time. TOC signing off.    Final next level of care: OP Rehab Barriers to Discharge: No Barriers Identified  Patient Goals and CMS Choice Choice offered to / list presented to : NA  Discharge Plan and Services Additional resources added to the After Visit Summary for          DME Arranged: N/A DME Agency: NA  Social Determinants of Health (SDOH) Interventions SDOH Screenings   Food Insecurity: No Food Insecurity (01/25/2023)  Housing: Low Risk  (01/25/2023)  Transportation Needs: No Transportation Needs (01/25/2023)  Utilities: Not At Risk (01/25/2023)  Tobacco Use: Low Risk  (01/26/2023)   Readmission Risk Interventions     No data to display

## 2023-01-26 NOTE — Progress Notes (Signed)
Physical Therapy Treatment Patient Details Name: Gabrielle [name] MRN: [id number] DOB: March 19, 1946 Today's Date: 01/26/2023   History of Present Illness Pt is a 77yo female s/p L TKA on 01/25/23. PMH: s/p R-TKA on 10/26/22. PMH: HTN, OSA, glaucoma, hypothyroidism.    PT Comments    Progressing toward goals however pt is unsteady with gait/stair training. Fatigues easily, pt is concerned about d/c today & having less help at home (does not have 24 hour assist which she had 3 most ago after her R TKA). 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 Will continue PT POC  Recommendations for follow up therapy are one component of a multi-disciplinary discharge planning process, led by the attending physician.  Recommendations may be updated based on patient status, additional functional criteria and insurance authorization.  Follow Up Recommendations       Assistance Recommended at Discharge Frequent or constant Supervision/Assistance  Patient can return home with the following A little help with walking and/or transfers;Assist for transportation;Help with stairs or ramp for entrance;Assistance with cooking/housework   Equipment Recommendations  None recommended by PT    Recommendations for Other Services       Precautions / Restrictions Precautions Precautions: Fall;Knee Restrictions Weight Bearing Restrictions: No Other Position/Activity Restrictions: WBAT     Mobility  Bed Mobility Overal bed mobility: Needs Assistance Bed Mobility: Sit to Supine       Sit to supine: Min assist, Min guard   General bed mobility comments: incr time and effort. cues to self assist with gait belt as leg lifter    Transfers Overall transfer level: Needs assistance Equipment used: Rolling walker (2 wheels) Transfers: Sit to/from Stand Sit to Stand: Min assist           General transfer comment: cues for hand placement and LLE Position. assist to rise and steady     Ambulation/Gait Ambulation/Gait assistance: Min assist Gait Distance (Feet): 40 Feet Assistive device: Rolling walker (2 wheels) Gait Pattern/deviations: Step-to pattern, Decreased stance time - left       General Gait Details: cues for sequence and RW distance from self; assist to steady while wt shifting   Stairs Stairs: Yes Stairs assistance: Min assist Stair Management: Two rails, Forwards, Step to pattern, No rails Number of Stairs: 3 General stair comments: last step with RW to simulate home; 2 steps with rails and min assist. very unsteady with up/down step. repetitious cues for sequence   Wheelchair Mobility    Modified Rankin (Stroke Patients Only)       Balance                                            Cognition Arousal/Alertness: Awake/alert Behavior During Therapy: WFL for tasks assessed/performed Overall Cognitive Status: Within Functional Limits for tasks assessed                                          Exercises Total Joint Exercises Ankle Circles/Pumps: AROM, Both, 10 reps Quad Sets: AROM, Both, 10 reps Heel Slides: AAROM, Left, 10 reps Hip ABduction/ADduction: AROM, AAROM, Left, 10 reps Straight Leg Raises: AAROM, Left, 5 reps, AROM (10 degree lag)    General Comments        Pertinent Vitals/Pain Pain Assessment Pain  Assessment: 0-10 Pain Score: 7  Pain Location: L knee Pain Descriptors / Indicators: Grimacing, Sore Pain Intervention(s): Limited activity within patient's tolerance, Monitored during session, Premedicated before session, Repositioned, Ice applied    Home Living                          Prior Function            PT Goals (current goals can now be found in the care plan section) Acute Rehab PT Goals Patient Stated Goal: no knee pain PT Goal Formulation: With patient Time For Goal Achievement: 02/01/23 Potential to Achieve Goals: Good Progress towards PT goals:  Progressing toward goals    Frequency    7X/week      PT Plan Current plan remains appropriate    Co-evaluation              AM-PAC PT "6 Clicks" Mobility   Outcome Measure  Help needed turning from your back to your side while in a flat bed without using bedrails?: A Little Help needed moving from lying on your back to sitting on the side of a flat bed without using bedrails?: A Little Help needed moving to and from a bed to a chair (including a wheelchair)?: A Little Help needed standing up from a chair using your arms (e.g., wheelchair or bedside chair)?: A Little Help needed to walk in hospital room?: A Little Help needed climbing 3-5 steps with a railing? : A Little 6 Click Score: 18    End of Session Equipment Utilized During Treatment: Gait belt Activity Tolerance: Patient tolerated treatment well Patient left: in bed;with bed alarm set;with call bell/phone within reach Nurse Communication: Mobility status PT Visit Diagnosis: Other abnormalities of gait and mobility (R26.89);Difficulty in walking, not elsewhere classified (R26.2)     Time: 1610-9604 PT Time Calculation (min) (ACUTE ONLY): 24 min  Charges:  $Gait Training: 8-22 mins $Therapeutic Exercise: 8-22 mins                     Delice Bison, PT  Acute Rehab Dept ([facility]) [phone]  01/26/2023    [facility] 01/26/2023, 11:02 AM

## 2023-01-27 DIAGNOSIS — Z96651 Presence of right artificial knee joint: Secondary | ICD-10-CM | POA: Diagnosis not present

## 2023-01-27 DIAGNOSIS — E039 Hypothyroidism, unspecified: Secondary | ICD-10-CM | POA: Diagnosis not present

## 2023-01-27 DIAGNOSIS — Z91040 Latex allergy status: Secondary | ICD-10-CM | POA: Diagnosis not present

## 2023-01-27 DIAGNOSIS — I10 Essential (primary) hypertension: Secondary | ICD-10-CM | POA: Diagnosis not present

## 2023-01-27 DIAGNOSIS — M1712 Unilateral primary osteoarthritis, left knee: Secondary | ICD-10-CM | POA: Diagnosis not present

## 2023-01-27 DIAGNOSIS — Z79899 Other long term (current) drug therapy: Secondary | ICD-10-CM | POA: Diagnosis not present

## 2023-01-27 DIAGNOSIS — J45909 Unspecified asthma, uncomplicated: Secondary | ICD-10-CM | POA: Diagnosis not present

## 2023-01-27 LAB — CBC
HCT: 33.2 % — ABNORMAL LOW (ref 36.0–46.0)
Hemoglobin: 10.9 g/dL — ABNORMAL LOW (ref 12.0–15.0)
MCH: 30.4 pg (ref 26.0–34.0)
MCHC: 32.8 g/dL (ref 30.0–36.0)
MCV: 92.5 fL (ref 80.0–100.0)
Platelets: 148 10*3/uL — ABNORMAL LOW (ref 150–400)
RBC: 3.59 MIL/uL — ABNORMAL LOW (ref 3.87–5.11)
RDW: 12.9 % (ref 11.5–15.5)
WBC: 6.2 10*3/uL (ref 4.0–10.5)
nRBC: 0 % (ref 0.0–0.2)

## 2023-01-27 MED ORDER — APIXABAN 2.5 MG PO TABS: 2.5000 mg | ORAL_TABLET | Freq: Two times a day (BID) | ORAL | 0 refills | Status: AC

## 2023-01-27 NOTE — Plan of Care (Signed)
  Problem: Elimination: Goal: Will not experience complications related to urinary retention Outcome: Progressing   Problem: Pain Managment: Goal: General experience of comfort will improve Outcome: Progressing   Problem: Safety: Goal: Ability to remain free from injury will improve Outcome: Progressing   

## 2023-01-27 NOTE — Progress Notes (Signed)
   Subjective: 2 Days Post-Op Procedure(s) (LRB): TOTAL KNEE ARTHROPLASTY (Left) Patient seen in rounds by Dr. Lequita Halt. Patient is well, and has had no acute complaints or problems. Denies SOB or chest pain. Denies calf pain. Patient reports pain as mild.  Worked with physical therapy yesterday and ambulated 60'.  Objective: Vital signs in last 24 hours: Temp:  [98 F (36.7 C)-98.5 F (36.9 C)] 98.5 F (36.9 C) (04/24 0612) Pulse Rate:  [67-77] 70 (04/24 0612) Resp:  [17-18] 17 (04/24 0612) BP: (139-141)/(60-84) 140/60 (04/24 0612) SpO2:  [94 %-100 %] 94 % (04/24 0612)  Intake/Output from previous day:  Intake/Output Summary (Last 24 hours) at 01/27/2023 0747 Last data filed at 01/26/2023 1932 Gross per 24 hour  Intake 720 ml  Output --  Net 720 ml    Intake/Output this shift: No intake/output data recorded.  Labs: Recent Labs    01/26/23 0424 01/27/23 0653  HGB 11.2* 10.9*   Recent Labs    01/26/23 0424 01/27/23 0653  WBC 7.8 6.2  RBC 3.73* 3.59*  HCT 34.6* 33.2*  PLT 170 148*   Recent Labs    01/26/23 0424  NA 138  K 3.7  CL 107  CO2 23  BUN 10  CREATININE 0.68  GLUCOSE 123*  CALCIUM 10.0   No results for input(s): "LABPT", "INR" in the last 72 hours.  Exam: General - Patient is Alert and Oriented Extremity - Neurologically intact Neurovascular intact Sensation intact distally Dorsiflexion/Plantar flexion intact Dressing/Incision - clean, dry, no drainage Motor Function - intact, moving foot and toes well on exam.  Past Medical History:  Diagnosis Date   Arthritis    Asthma    childhood   Glaucoma    Hypertension    Hypothyroidism    Sleep apnea    not used in 7-8 years per pt   Thyroid disease     Assessment/Plan: 2 Days Post-Op Procedure(s) (LRB): TOTAL KNEE ARTHROPLASTY (Left) Principal Problem:   OA (osteoarthritis) of knee Active Problems:   Primary osteoarthritis of left knee  Estimated body mass index is 24.21 kg/m as  calculated from the following:   Height as of this encounter:  (1.651 m).   Weight as of this encounter: 66 kg.  DVT Prophylaxis - Patient received Xarelto in hospital but will switch to Eliquis 2.5 mg BID upon discharge as pharmacy does not have Xarelto in stock. Weight-bearing as tolerated.  Continue physical therapy. Expected discharge home today pending progress and if meeting patient goals. Scheduled for OPPT at [facility]. Follow-up in clinic in 2 weeks.  Alfonzo Feller, PA-C Orthopedic Surgery 01/27/2023, 7:47 AM

## 2023-01-27 NOTE — Progress Notes (Signed)
Physical Therapy Treatment Patient Details Name: Gabrielle [name] MRN: [id number] DOB: 14-Jun-1946 Today's Date: 01/27/2023   History of Present Illness Pt is a 77yo female s/p L TKA on 01/25/23. PMH: s/p R-TKA on 10/26/22. PMH: HTN, OSA, glaucoma, hypothyroidism.    PT Comments    Pt meeting PT goals, reports feeling much better today. Amb hallway/household distance, reviewed stairs and HEP. Pt feels ready to d/c home with assist as needed.    Recommendations for follow up therapy are one component of a multi-disciplinary discharge planning process, led by the attending physician.  Recommendations may be updated based on patient status, additional functional criteria and insurance authorization.  Follow Up Recommendations       Assistance Recommended at Discharge Frequent or constant Supervision/Assistance  Patient can return home with the following A little help with walking and/or transfers;Assist for transportation;Help with stairs or ramp for entrance;Assistance with cooking/housework   Equipment Recommendations  None recommended by PT    Recommendations for Other Services       Precautions / Restrictions Precautions Precautions: Fall;Knee Restrictions Weight Bearing Restrictions: No Other Position/Activity Restrictions: WBAT     Mobility  Bed Mobility               General bed mobility comments: in recliner    Transfers Overall transfer level: Needs assistance Equipment used: Rolling walker (2 wheels) Transfers: Sit to/from Stand Sit to Stand: Supervision           General transfer comment: cues for hand placement and LLE Position.  supervision for safety    Ambulation/Gait Ambulation/Gait assistance: Min guard, Supervision Gait Distance (Feet): 80 Feet Assistive device: Rolling walker (2 wheels) Gait Pattern/deviations: Step-to pattern, Decreased stance time - left, Decreased weight shift to left       General Gait Details: cues for sequence and RW  distance from self; close supervision to min-guard for safety, no overt LOB. improving wt shift to LL E   Stairs Stairs: Yes Stairs assistance: Min guard, Min assist Stair Management: No rails, Step to pattern, Forwards, With walker Number of Stairs: 1 (x2) General stair comments: cues for sequence, min assist on initial trial to maneuver RW to top of step, min-guard on 2nd trial. no knee buckling noted, no LOB   Wheelchair Mobility    Modified Rankin (Stroke Patients Only)       Balance                                            Cognition Arousal/Alertness: Awake/alert Behavior During Therapy: WFL for tasks assessed/performed Overall Cognitive Status: Within Functional Limits for tasks assessed                                          Exercises Total Joint Exercises Ankle Circles/Pumps: AROM, Both, 10 reps Quad Sets: AROM, 5 reps, Both Heel Slides: AAROM, Left, 10 reps    General Comments        Pertinent Vitals/Pain Pain Assessment Pain Assessment: 0-10 Pain Score: 4  Pain Location: L knee Pain Descriptors / Indicators: Grimacing, Sore Pain Intervention(s): Limited activity within patient's tolerance, Monitored during session, Premedicated before session    Home Living  Prior Function            PT Goals (current goals can now be found in the care plan section) Acute Rehab PT Goals Patient Stated Goal: no knee pain PT Goal Formulation: With patient Time For Goal Achievement: 02/01/23 Potential to Achieve Goals: Good Progress towards PT goals: Progressing toward goals    Frequency    7X/week      PT Plan Current plan remains appropriate    Co-evaluation              AM-PAC PT "6 Clicks" Mobility   Outcome Measure  Help needed turning from your back to your side while in a flat bed without using bedrails?: A Little Help needed moving from lying on your back to  sitting on the side of a flat bed without using bedrails?: A Little Help needed moving to and from a bed to a chair (including a wheelchair)?: A Little Help needed standing up from a chair using your arms (e.g., wheelchair or bedside chair)?: A Little Help needed to walk in hospital room?: A Little Help needed climbing 3-5 steps with a railing? : A Little 6 Click Score: 18    End of Session Equipment Utilized During Treatment: Gait belt Activity Tolerance: Patient tolerated treatment well Patient left: with call bell/phone within reach;in chair;with chair alarm set Nurse Communication: Mobility status PT Visit Diagnosis: Other abnormalities of gait and mobility (R26.89);Difficulty in walking, not elsewhere classified (R26.2)     Time: 9604-5409 PT Time Calculation (min) (ACUTE ONLY): 24 min  Charges:  $Gait Training: 23-37 mins                     Tennelle Taflinger, PT  Acute Rehab Dept ([facility]) [phone]  01/27/2023    [facility] 01/27/2023, 11:00 AM

## 2023-01-27 NOTE — Plan of Care (Signed)
  Problem: Education: Goal: Knowledge of the prescribed therapeutic regimen will improve Outcome: Adequate for Discharge Goal: Individualized Educational Video(s) Outcome: Adequate for Discharge   Problem: Activity: Goal: Ability to avoid complications of mobility impairment will improve Outcome: Adequate for Discharge Goal: Range of joint motion will improve Outcome: Adequate for Discharge   Problem: Clinical Measurements: Goal: Postoperative complications will be avoided or minimized Outcome: Adequate for Discharge   Problem: Pain Management: Goal: Pain level will decrease with appropriate interventions Outcome: Adequate for Discharge   Problem: Skin Integrity: Goal: Will show signs of wound healing Outcome: Adequate for Discharge   Problem: Education: Goal: Knowledge of General Education information will improve Description: Including pain rating scale, medication(s)/side effects and non-pharmacologic comfort measures Outcome: Adequate for Discharge   Problem: Health Behavior/Discharge Planning: Goal: Ability to manage health-related needs will improve Outcome: Adequate for Discharge   Problem: Clinical Measurements: Goal: Ability to maintain clinical measurements within normal limits will improve Outcome: Adequate for Discharge Goal: Will remain free from infection Outcome: Adequate for Discharge Goal: Diagnostic test results will improve Outcome: Adequate for Discharge Goal: Respiratory complications will improve Outcome: Adequate for Discharge Goal: Cardiovascular complication will be avoided Outcome: Adequate for Discharge   Problem: Activity: Goal: Risk for activity intolerance will decrease Outcome: Adequate for Discharge   Problem: Nutrition: Goal: Adequate nutrition will be maintained Outcome: Adequate for Discharge   Problem: Coping: Goal: Level of anxiety will decrease Outcome: Adequate for Discharge   Problem: Elimination: Goal: Will not  experience complications related to bowel motility Outcome: Adequate for Discharge Goal: Will not experience complications related to urinary retention 01/27/2023 1029 by Delila Spence, LPN Outcome: Adequate for Discharge 01/27/2023 0757 by Delila Spence, LPN Outcome: Progressing   Problem: Pain Managment: Goal: General experience of comfort will improve 01/27/2023 1029 by Delila Spence, LPN Outcome: Adequate for Discharge 01/27/2023 0757 by Delila Spence, LPN Outcome: Progressing   Problem: Safety: Goal: Ability to remain free from injury will improve 01/27/2023 1029 by Delila Spence, LPN Outcome: Adequate for Discharge 01/27/2023 0757 by Delila Spence, LPN Outcome: Progressing   Problem: Skin Integrity: Goal: Risk for impaired skin integrity will decrease Outcome: Adequate for Discharge

## 2023-01-28 NOTE — Discharge Summary (Signed)
Physician Discharge Summary   Patient ID: Gabrielle [name] MRN: [id number] DOB/AGE: 10/19/1945 77 y.o.  Admit date: 01/25/2023 Discharge date: 01/27/2023  Primary Diagnosis: Osteoarthritis left knee    Admission Diagnoses:  Past Medical History:  Diagnosis Date   Arthritis    Asthma    childhood   Glaucoma    Hypertension    Hypothyroidism    Sleep apnea    not used in 7-8 years per pt   Thyroid disease    Discharge Diagnoses:   Principal Problem:   OA (osteoarthritis) of knee Active Problems:   Primary osteoarthritis of left knee  Estimated body mass index is 24.21 kg/m as calculated from the following:   Height as of this encounter: 5\' 5"  (1.651 m).   Weight as of this encounter: 66 kg.  Procedure:  Procedure(s) (LRB): TOTAL KNEE ARTHROPLASTY (Left)   Consults: None  HPI: Gabrielle [name] is a 77 y.o. year old female with end stage OA of her left knee with progressively worsening pain and dysfunction. She has constant pain, with activity and at rest and significant functional deficits with difficulties even with ADLs. She has had extensive non-op management including analgesics, injections of cortisone and viscosupplements, and home exercise program, but remains in significant pain with significant dysfunction. Radiographs show bone on bone arthritis lateral and patellofemoral. She presents now for left Total Knee Arthroplasty.   Laboratory Data: Admission on 01/25/2023, Discharged on 01/27/2023  Component Date Value Ref Range Status   Glucose-Capillary 01/25/2023 169 (H)  70 - 99 mg/dL Final   Glucose reference range applies only to samples taken after fasting for at least 8 hours.   WBC 01/26/2023 7.8  4.0 - 10.5 K/uL Final   RBC 01/26/2023 3.73 (L)  3.87 - 5.11 MIL/uL Final   Hemoglobin 01/26/2023 11.2 (L)  12.0 - 15.0 g/dL Final   HCT 40/98/1191 34.6 (L)  36.0 - 46.0 % Final   MCV 01/26/2023 92.8  80.0 - 100.0 fL Final   MCH 01/26/2023 30.0  26.0 - 34.0 pg Final    MCHC 01/26/2023 32.4  30.0 - 36.0 g/dL Final   RDW 47/82/9562 12.8  11.5 - 15.5 % Final   Platelets 01/26/2023 170  150 - 400 K/uL Final   nRBC 01/26/2023 0.0  0.0 - 0.2 % Final   Performed at [facility], [address] [place], [place] [address]   Sodium 01/26/2023 138  135 - 145 mmol/L Final   Potassium 01/26/2023 3.7  3.5 - 5.1 mmol/L Final   Chloride 01/26/2023 107  98 - 111 mmol/L Final   CO2 01/26/2023 23  22 - 32 mmol/L Final   Glucose, Bld 01/26/2023 123 (H)  70 - 99 mg/dL Final   Glucose reference range applies only to samples taken after fasting for at least 8 hours.   BUN 01/26/2023 10  8 - 23 mg/dL Final   Creatinine, Ser 01/26/2023 0.68  0.44 - 1.00 mg/dL Final   Calcium 57/84/6962 10.0  8.9 - 10.3 mg/dL Final   GFR, Estimated 01/26/2023 >60  >60 mL/min Final   Comment: (NOTE) Calculated using the CKD-EPI Creatinine Equation (2021)    Anion gap 01/26/2023 8  5 - 15 Final   Performed at [facility], [address], [place], [place] [address]   WBC 01/27/2023 6.2  4.0 - 10.5 K/uL Final   RBC 01/27/2023 3.59 (L)  3.87 - 5.11 MIL/uL Final   Hemoglobin 01/27/2023 10.9 (L)  12.0 -  15.0 g/dL Final   HCT 16/07/9603 33.2 (L)  36.0 - 46.0 % Final   MCV 01/27/2023 92.5  80.0 - 100.0 fL Final   MCH 01/27/2023 30.4  26.0 - 34.0 pg Final   MCHC 01/27/2023 32.8  30.0 - 36.0 g/dL Final   RDW 54/06/8118 12.9  11.5 - 15.5 % Final   Platelets 01/27/2023 148 (L)  150 - 400 K/uL Final   nRBC 01/27/2023 0.0  0.0 - 0.2 % Final   Performed at [facility], [address] [place], [place] [address]  Hospital Outpatient Visit on 01/12/2023  Component Date Value Ref Range Status   MRSA, PCR 01/12/2023 NEGATIVE  NEGATIVE Final   Staphylococcus aureus 01/12/2023 NEGATIVE  NEGATIVE Final   Comment: (NOTE) The Xpert SA Assay (FDA approved for NASAL specimens in patients 19 years of age and older), is one component of a  comprehensive surveillance program. It is not intended to diagnose infection nor to guide or monitor treatment. Performed at [facility], [address], [place], [place] [address]    Sodium 01/12/2023 139  135 - 145 mmol/L Final   Potassium 01/12/2023 3.7  3.5 - 5.1 mmol/L Final   Chloride 01/12/2023 108  98 - 111 mmol/L Final   CO2 01/12/2023 25  22 - 32 mmol/L Final   Glucose, Bld 01/12/2023 100 (H)  70 - 99 mg/dL Final   Glucose reference range applies only to samples taken after fasting for at least 8 hours.   BUN 01/12/2023 10  8 - 23 mg/dL Final   Creatinine, Ser 01/12/2023 0.57  0.44 - 1.00 mg/dL Final   Calcium 30/86/5784 9.9  8.9 - 10.3 mg/dL Final   GFR, Estimated 01/12/2023 >60  >60 mL/min Final   Comment: (NOTE) Calculated using the CKD-EPI Creatinine Equation (2021)    Anion gap 01/12/2023 6  5 - 15 Final   Performed at [facility], [address], [place], [place] [address]   WBC 01/12/2023 4.3  4.0 - 10.5 K/uL Final   RBC 01/12/2023 4.13  3.87 - 5.11 MIL/uL Final   Hemoglobin 01/12/2023 12.6  12.0 - 15.0 g/dL Final   HCT 52/84/1324 39.2  36.0 - 46.0 % Final   MCV 01/12/2023 94.9  80.0 - 100.0 fL Final   MCH 01/12/2023 30.5  26.0 - 34.0 pg Final   MCHC 01/12/2023 32.1  30.0 - 36.0 g/dL Final   RDW 40/07/2724 12.6  11.5 - 15.5 % Final   Platelets 01/12/2023 187  150 - 400 K/uL Final   nRBC 01/12/2023 0.0  0.0 - 0.2 % Final   Performed at [facility], [address], [place], [place] [address]   Glucose-Capillary 01/12/2023 141 (H)  70 - 99 mg/dL Final   Glucose reference range applies only to samples taken after fasting for at least 8 hours.   Hgb A1c MFr Bld 01/12/2023 5.6  4.8 - 5.6 % Final   Comment: (NOTE) Pre diabetes:          5.7%-6.4%  Diabetes:              >6.4%  Glycemic control for   <7.0% adults with diabetes    Mean Plasma Glucose 01/12/2023 114.02  mg/dL Final   Performed at [facility], [address] [place], [place] [address]     X-Rays:No results found.  EKG: Orders placed or performed during the hospital encounter of 04/17/22   EKG  12-Lead   EKG 12-Lead   ED EKG   ED EKG   EKG     Hospital Course: Gabrielle [name] is a 77 y.o. who was admitted to [facility]. They were brought to the operating room on 01/25/2023 and underwent Procedure(s): TOTAL KNEE ARTHROPLASTY.  Patient tolerated the procedure well and was later transferred to the recovery room and then to the orthopaedic floor for postoperative care. They were given PO and IV analgesics for pain control following their surgery. They were given 24 hours of postoperative antibiotics of  Anti-infectives (From admission, onward)    Start     Dose/Rate Route Frequency Ordered Stop   01/25/23 1600  ceFAZolin (ANCEF) IVPB 2g/100 mL premix        2 g 200 mL/hr over 30 Minutes Intravenous Every 6 hours 01/25/23 1319 01/25/23 2137   01/25/23 0815  ceFAZolin (ANCEF) IVPB 2g/100 mL premix        2 g 200 mL/hr over 30 Minutes Intravenous On call to O.R. 01/25/23 1610 01/25/23 1025     and started on DVT prophylaxis in the form of Xarelto.   PT and OT were ordered for total joint protocol. Discharge planning consulted to help with post-op disposition and equipment needs. Patient had a fair night on the evening of surgery. They started to get up OOB with physical therapy on POD #0. Continued to work with physical therapy into POD #2. Patient was seen during rounds on day two and was ready to go home pending progress with physical therapy. Patient worked with physical therapy for a total of 4 sessions and was meeting their goals. Dressing was changed and the incision was C/D/I.  They were discharged home later that day in stable condition.  Diet: Regular diet Activity: WBAT Follow-up: in 2 weeks Disposition: Home Discharged Condition: stable   Discharge Instructions     Call MD / Call 911   Complete  by: As directed    If you experience chest pain or shortness of breath, CALL 911 and be transported to the hospital emergency room.  If you develope a fever above 101 F, pus (white drainage) or increased drainage or redness at the wound, or calf pain, call your surgeon's office.   Change dressing   Complete by: As directed    You may remove the bulky bandage (ACE wrap and gauze) two days after surgery. You will have an adhesive waterproof bandage underneath. Leave this in place until your first follow-up appointment.   Constipation Prevention   Complete by: As directed    Drink plenty of fluids.  Prune juice may be helpful.  You may use a stool softener, such as Colace (over the counter) 100 mg twice a day.  Use MiraLax (over the counter) for constipation as needed.   Diet - low sodium heart healthy   Complete by: As directed    Do not put a pillow under the knee. Place it under the heel.   Complete by: As directed    Driving restrictions   Complete by: As directed    No driving for two weeks   Post-operative opioid taper instructions:   Complete by: As directed    POST-OPERATIVE OPIOID TAPER INSTRUCTIONS: It is important to wean off of your opioid medication as soon as possible. If you do not need pain medication after your surgery it is ok to stop day one. Opioids include: Codeine, Hydrocodone(Norco, Vicodin), Oxycodone(Percocet, oxycontin) and hydromorphone amongst others.  Long term  and even short term use of opiods can cause: Increased pain response Dependence Constipation Depression Respiratory depression And more.  Withdrawal symptoms can include Flu like symptoms Nausea, vomiting And more Techniques to manage these symptoms Hydrate well Eat regular healthy meals Stay active Use relaxation techniques(deep breathing, meditating, yoga) Do Not substitute Alcohol to help with tapering If you have been on opioids for less than two weeks and do not have pain than it is ok to  stop all together.  Plan to wean off of opioids This plan should start within one week post op of your joint replacement. Maintain the same interval or time between taking each dose and first decrease the dose.  Cut the total daily intake of opioids by one tablet each day Next start to increase the time between doses. The last dose that should be eliminated is the evening dose.      TED hose   Complete by: As directed    Use stockings (TED hose) for three weeks on both leg(s).  You may remove them at night for sleeping.   Weight bearing as tolerated   Complete by: As directed       Allergies as of 01/27/2023       Reactions   Lidocaine Swelling, Other (See Comments)   Twitching, facial swelling, and shaking    Aspirin Other (See Comments)   Increases heart rate   Hylan G-f 20 Swelling, Other (See Comments)   Decreased facial feeling and chills- also   Lisinopril Other (See Comments), Cough   Dry cough   Losartan Other (See Comments)   Dizziness/headaches   Metoprolol Other (See Comments)   Dizziness   Olmesartan Other (See Comments)   Hair loss   Codeine Hives, Rash, Other (See Comments)   Hallucinations and syncope, also   Latex Rash   Sulfa Antibiotics Hives, Rash   Wound Dressing Adhesive Rash   Blister breakout from knee replacement dressing, pt described as a clear dressing        Medication List     STOP taking these medications    chlorhexidine 0.12 % solution Commonly known as: PERIDEX   hydrochlorothiazide 25 MG tablet Commonly known as: HYDRODIURIL   HYDROmorphone 2 MG tablet Commonly known as: DILAUDID   triamcinolone cream 0.1 % Commonly known as: KENALOG   Voltaren 1 % Gel Generic drug: diclofenac Sodium       TAKE these medications    acetaminophen 500 MG tablet Commonly known as: TYLENOL Take 500 mg by mouth as needed for moderate pain.   amLODipine 5 MG tablet Commonly known as: NORVASC Take 5 mg by mouth daily.   apixaban  2.5 MG Tabs tablet Commonly known as: Eliquis Take 1 tablet (2.5 mg total) by mouth 2 (two) times daily for 19 days.   ezetimibe 10 MG tablet Commonly known as: ZETIA Take 10 mg by mouth daily.   HYDROcodone-acetaminophen 5-325 MG tablet Commonly known as: NORCO/VICODIN Take 1-2 tablets by mouth every 6 (six) hours as needed for severe pain.   lactose free nutrition Liqd Take 237 mLs by mouth 2 (two) times daily between meals. What changed: Another medication with the same name was removed. Continue taking this medication, and follow the directions you see here.   levothyroxine 50 MCG tablet Commonly known as: SYNTHROID Take 50 mcg by mouth daily before breakfast.   methocarbamol 500 MG tablet Commonly known as: ROBAXIN Take 1 tablet (500 mg total) by mouth every 6 (six) hours as  needed for muscle spasms.   polyethylene glycol 17 g packet Commonly known as: MIRALAX / GLYCOLAX Take 17 g by mouth daily as needed for mild constipation.   potassium chloride 10 MEQ tablet Commonly known as: KLOR-CON Take 10 mEq by mouth every morning.   raloxifene 60 MG tablet Commonly known as: EVISTA Take 60 mg by mouth every morning.   rosuvastatin 5 MG tablet Commonly known as: CRESTOR Take 5 mg by mouth 3 (three) times a week.   traMADol 50 MG tablet Commonly known as: ULTRAM Take 1-2 tablets (50-100 mg total) by mouth every 6 (six) hours as needed for moderate pain.   ZyrTEC Allergy 10 MG tablet Generic drug: cetirizine Take 10 mg by mouth daily.               Discharge Care Instructions  (From admission, onward)           Start     Ordered   01/26/23 0000  Weight bearing as tolerated        01/26/23 0806   01/26/23 0000  Change dressing       Comments: You may remove the bulky bandage (ACE wrap and gauze) two days after surgery. You will have an adhesive waterproof bandage underneath. Leave this in place until your first follow-up appointment.   01/26/23 1610             Follow-up Information     Ollen Gross, MD. Go on 02/10/2023.   Specialty: Orthopedic Surgery Why: You are scheduled for first post op appt on Wednesday May 8 at 2:45pm. Contact information: [address] [place] [address] [phone]                 Signed: R. Arcola Jansky, PA-C Orthopedic Surgery 01/28/2023, 7:26 AM

## 2023-01-29 DIAGNOSIS — M25662 Stiffness of left knee, not elsewhere classified: Secondary | ICD-10-CM | POA: Diagnosis not present

## 2023-01-29 DIAGNOSIS — M25562 Pain in left knee: Secondary | ICD-10-CM | POA: Diagnosis not present

## 2023-02-01 DIAGNOSIS — M25662 Stiffness of left knee, not elsewhere classified: Secondary | ICD-10-CM | POA: Diagnosis not present

## 2023-02-01 DIAGNOSIS — M25562 Pain in left knee: Secondary | ICD-10-CM | POA: Diagnosis not present

## 2023-02-03 DIAGNOSIS — M25662 Stiffness of left knee, not elsewhere classified: Secondary | ICD-10-CM | POA: Diagnosis not present

## 2023-02-03 DIAGNOSIS — M25562 Pain in left knee: Secondary | ICD-10-CM | POA: Diagnosis not present

## 2023-02-05 DIAGNOSIS — M25662 Stiffness of left knee, not elsewhere classified: Secondary | ICD-10-CM | POA: Diagnosis not present

## 2023-02-05 DIAGNOSIS — M25562 Pain in left knee: Secondary | ICD-10-CM | POA: Diagnosis not present

## 2023-02-08 DIAGNOSIS — M25562 Pain in left knee: Secondary | ICD-10-CM | POA: Diagnosis not present

## 2023-02-08 DIAGNOSIS — M25662 Stiffness of left knee, not elsewhere classified: Secondary | ICD-10-CM | POA: Diagnosis not present

## 2023-02-10 DIAGNOSIS — M25662 Stiffness of left knee, not elsewhere classified: Secondary | ICD-10-CM | POA: Diagnosis not present

## 2023-02-10 DIAGNOSIS — M25562 Pain in left knee: Secondary | ICD-10-CM | POA: Diagnosis not present

## 2023-02-12 DIAGNOSIS — M25662 Stiffness of left knee, not elsewhere classified: Secondary | ICD-10-CM | POA: Diagnosis not present

## 2023-02-12 DIAGNOSIS — M25562 Pain in left knee: Secondary | ICD-10-CM | POA: Diagnosis not present

## 2023-02-15 DIAGNOSIS — M25562 Pain in left knee: Secondary | ICD-10-CM | POA: Diagnosis not present

## 2023-02-15 DIAGNOSIS — M25662 Stiffness of left knee, not elsewhere classified: Secondary | ICD-10-CM | POA: Diagnosis not present

## 2023-02-19 DIAGNOSIS — M25662 Stiffness of left knee, not elsewhere classified: Secondary | ICD-10-CM | POA: Diagnosis not present

## 2023-02-19 DIAGNOSIS — M25562 Pain in left knee: Secondary | ICD-10-CM | POA: Diagnosis not present

## 2023-02-22 DIAGNOSIS — M25562 Pain in left knee: Secondary | ICD-10-CM | POA: Diagnosis not present

## 2023-02-22 DIAGNOSIS — M25662 Stiffness of left knee, not elsewhere classified: Secondary | ICD-10-CM | POA: Diagnosis not present

## 2023-02-25 DIAGNOSIS — M25562 Pain in left knee: Secondary | ICD-10-CM | POA: Diagnosis not present

## 2023-02-25 DIAGNOSIS — M25662 Stiffness of left knee, not elsewhere classified: Secondary | ICD-10-CM | POA: Diagnosis not present

## 2023-03-02 DIAGNOSIS — M25662 Stiffness of left knee, not elsewhere classified: Secondary | ICD-10-CM | POA: Diagnosis not present

## 2023-03-02 DIAGNOSIS — Z5189 Encounter for other specified aftercare: Secondary | ICD-10-CM | POA: Diagnosis not present

## 2023-03-02 DIAGNOSIS — M25562 Pain in left knee: Secondary | ICD-10-CM | POA: Diagnosis not present

## 2023-03-05 DIAGNOSIS — M25562 Pain in left knee: Secondary | ICD-10-CM | POA: Diagnosis not present

## 2023-03-05 DIAGNOSIS — M25662 Stiffness of left knee, not elsewhere classified: Secondary | ICD-10-CM | POA: Diagnosis not present

## 2023-03-09 DIAGNOSIS — M25662 Stiffness of left knee, not elsewhere classified: Secondary | ICD-10-CM | POA: Diagnosis not present

## 2023-03-09 DIAGNOSIS — M25562 Pain in left knee: Secondary | ICD-10-CM | POA: Diagnosis not present

## 2023-03-11 DIAGNOSIS — M25562 Pain in left knee: Secondary | ICD-10-CM | POA: Diagnosis not present

## 2023-03-11 DIAGNOSIS — M25662 Stiffness of left knee, not elsewhere classified: Secondary | ICD-10-CM | POA: Diagnosis not present

## 2023-03-25 DIAGNOSIS — M7541 Impingement syndrome of right shoulder: Secondary | ICD-10-CM | POA: Diagnosis not present

## 2023-03-25 DIAGNOSIS — M7542 Impingement syndrome of left shoulder: Secondary | ICD-10-CM | POA: Diagnosis not present

## 2023-03-26 DIAGNOSIS — M25662 Stiffness of left knee, not elsewhere classified: Secondary | ICD-10-CM | POA: Diagnosis not present

## 2023-03-26 DIAGNOSIS — M25562 Pain in left knee: Secondary | ICD-10-CM | POA: Diagnosis not present

## 2023-03-30 DIAGNOSIS — M25562 Pain in left knee: Secondary | ICD-10-CM | POA: Diagnosis not present

## 2023-03-30 DIAGNOSIS — M25662 Stiffness of left knee, not elsewhere classified: Secondary | ICD-10-CM | POA: Diagnosis not present

## 2023-04-01 DIAGNOSIS — M25662 Stiffness of left knee, not elsewhere classified: Secondary | ICD-10-CM | POA: Diagnosis not present

## 2023-04-01 DIAGNOSIS — M25562 Pain in left knee: Secondary | ICD-10-CM | POA: Diagnosis not present

## 2023-04-07 DIAGNOSIS — M25562 Pain in left knee: Secondary | ICD-10-CM | POA: Diagnosis not present

## 2023-04-07 DIAGNOSIS — M25662 Stiffness of left knee, not elsewhere classified: Secondary | ICD-10-CM | POA: Diagnosis not present

## 2023-04-09 DIAGNOSIS — M25562 Pain in left knee: Secondary | ICD-10-CM | POA: Diagnosis not present

## 2023-04-09 DIAGNOSIS — M25662 Stiffness of left knee, not elsewhere classified: Secondary | ICD-10-CM | POA: Diagnosis not present

## 2023-04-14 DIAGNOSIS — M170 Bilateral primary osteoarthritis of knee: Secondary | ICD-10-CM | POA: Diagnosis not present

## 2023-04-14 DIAGNOSIS — Z0000 Encounter for general adult medical examination without abnormal findings: Secondary | ICD-10-CM | POA: Diagnosis not present

## 2023-04-14 DIAGNOSIS — E039 Hypothyroidism, unspecified: Secondary | ICD-10-CM | POA: Diagnosis not present

## 2023-04-14 DIAGNOSIS — Z23 Encounter for immunization: Secondary | ICD-10-CM | POA: Diagnosis not present

## 2023-04-14 DIAGNOSIS — I10 Essential (primary) hypertension: Secondary | ICD-10-CM | POA: Diagnosis not present

## 2023-04-14 DIAGNOSIS — E7800 Pure hypercholesterolemia, unspecified: Secondary | ICD-10-CM | POA: Diagnosis not present

## 2023-04-14 DIAGNOSIS — Z1211 Encounter for screening for malignant neoplasm of colon: Secondary | ICD-10-CM | POA: Diagnosis not present

## 2023-04-14 DIAGNOSIS — Z9181 History of falling: Secondary | ICD-10-CM | POA: Diagnosis not present

## 2023-04-14 DIAGNOSIS — M8580 Other specified disorders of bone density and structure, unspecified site: Secondary | ICD-10-CM | POA: Diagnosis not present

## 2023-04-27 DIAGNOSIS — I10 Essential (primary) hypertension: Secondary | ICD-10-CM | POA: Diagnosis not present

## 2023-04-27 DIAGNOSIS — Z1211 Encounter for screening for malignant neoplasm of colon: Secondary | ICD-10-CM | POA: Diagnosis not present

## 2023-04-27 DIAGNOSIS — D509 Iron deficiency anemia, unspecified: Secondary | ICD-10-CM | POA: Diagnosis not present

## 2023-04-27 DIAGNOSIS — E785 Hyperlipidemia, unspecified: Secondary | ICD-10-CM | POA: Diagnosis not present

## 2023-04-27 DIAGNOSIS — Z86010 Personal history of colonic polyps: Secondary | ICD-10-CM | POA: Diagnosis not present

## 2023-04-27 DIAGNOSIS — E039 Hypothyroidism, unspecified: Secondary | ICD-10-CM | POA: Diagnosis not present

## 2023-04-27 DIAGNOSIS — K5900 Constipation, unspecified: Secondary | ICD-10-CM | POA: Diagnosis not present

## 2023-04-27 DIAGNOSIS — K5730 Diverticulosis of large intestine without perforation or abscess without bleeding: Secondary | ICD-10-CM | POA: Diagnosis not present

## 2023-05-14 DIAGNOSIS — Z5189 Encounter for other specified aftercare: Secondary | ICD-10-CM | POA: Diagnosis not present

## 2023-05-31 DIAGNOSIS — Z800 Family history of malignant neoplasm of digestive organs: Secondary | ICD-10-CM | POA: Diagnosis not present

## 2023-05-31 DIAGNOSIS — Z1211 Encounter for screening for malignant neoplasm of colon: Secondary | ICD-10-CM | POA: Diagnosis not present

## 2023-05-31 DIAGNOSIS — Z86010 Personal history of colonic polyps: Secondary | ICD-10-CM | POA: Diagnosis not present

## 2023-05-31 DIAGNOSIS — K5730 Diverticulosis of large intestine without perforation or abscess without bleeding: Secondary | ICD-10-CM | POA: Diagnosis not present

## 2023-05-31 DIAGNOSIS — Z09 Encounter for follow-up examination after completed treatment for conditions other than malignant neoplasm: Secondary | ICD-10-CM | POA: Diagnosis not present

## 2023-06-14 DIAGNOSIS — M65331 Trigger finger, right middle finger: Secondary | ICD-10-CM | POA: Diagnosis not present

## 2023-06-24 DIAGNOSIS — M7542 Impingement syndrome of left shoulder: Secondary | ICD-10-CM | POA: Diagnosis not present

## 2023-06-24 DIAGNOSIS — M7541 Impingement syndrome of right shoulder: Secondary | ICD-10-CM | POA: Diagnosis not present

## 2023-06-30 DIAGNOSIS — H26492 Other secondary cataract, left eye: Secondary | ICD-10-CM | POA: Diagnosis not present

## 2023-06-30 DIAGNOSIS — H02834 Dermatochalasis of left upper eyelid: Secondary | ICD-10-CM | POA: Diagnosis not present

## 2023-06-30 DIAGNOSIS — H02831 Dermatochalasis of right upper eyelid: Secondary | ICD-10-CM | POA: Diagnosis not present

## 2023-06-30 DIAGNOSIS — H402232 Chronic angle-closure glaucoma, bilateral, moderate stage: Secondary | ICD-10-CM | POA: Diagnosis not present

## 2023-07-21 DIAGNOSIS — E785 Hyperlipidemia, unspecified: Secondary | ICD-10-CM | POA: Diagnosis not present

## 2023-07-21 DIAGNOSIS — M1990 Unspecified osteoarthritis, unspecified site: Secondary | ICD-10-CM | POA: Diagnosis not present

## 2023-07-21 DIAGNOSIS — Z8249 Family history of ischemic heart disease and other diseases of the circulatory system: Secondary | ICD-10-CM | POA: Diagnosis not present

## 2023-07-21 DIAGNOSIS — I10 Essential (primary) hypertension: Secondary | ICD-10-CM | POA: Diagnosis not present

## 2023-07-21 DIAGNOSIS — Z809 Family history of malignant neoplasm, unspecified: Secondary | ICD-10-CM | POA: Diagnosis not present

## 2023-07-21 DIAGNOSIS — R32 Unspecified urinary incontinence: Secondary | ICD-10-CM | POA: Diagnosis not present

## 2023-07-21 DIAGNOSIS — E876 Hypokalemia: Secondary | ICD-10-CM | POA: Diagnosis not present

## 2023-07-21 DIAGNOSIS — H409 Unspecified glaucoma: Secondary | ICD-10-CM | POA: Diagnosis not present

## 2023-07-21 DIAGNOSIS — M810 Age-related osteoporosis without current pathological fracture: Secondary | ICD-10-CM | POA: Diagnosis not present

## 2023-08-03 DIAGNOSIS — R072 Precordial pain: Secondary | ICD-10-CM | POA: Diagnosis not present

## 2023-08-03 DIAGNOSIS — R7303 Prediabetes: Secondary | ICD-10-CM | POA: Diagnosis not present

## 2023-08-03 DIAGNOSIS — E7800 Pure hypercholesterolemia, unspecified: Secondary | ICD-10-CM | POA: Diagnosis not present

## 2023-08-03 DIAGNOSIS — Z23 Encounter for immunization: Secondary | ICD-10-CM | POA: Diagnosis not present

## 2023-08-03 DIAGNOSIS — E039 Hypothyroidism, unspecified: Secondary | ICD-10-CM | POA: Diagnosis not present

## 2023-08-03 DIAGNOSIS — M8580 Other specified disorders of bone density and structure, unspecified site: Secondary | ICD-10-CM | POA: Diagnosis not present

## 2023-08-03 DIAGNOSIS — I10 Essential (primary) hypertension: Secondary | ICD-10-CM | POA: Diagnosis not present

## 2023-08-03 DIAGNOSIS — R002 Palpitations: Secondary | ICD-10-CM | POA: Diagnosis not present

## 2023-08-13 DIAGNOSIS — H2513 Age-related nuclear cataract, bilateral: Secondary | ICD-10-CM | POA: Diagnosis not present

## 2023-08-16 ENCOUNTER — Other Ambulatory Visit: Payer: Self-pay | Admitting: Family Medicine

## 2023-08-16 DIAGNOSIS — Z1231 Encounter for screening mammogram for malignant neoplasm of breast: Secondary | ICD-10-CM

## 2023-08-17 IMAGING — MG MM DIGITAL SCREENING BILAT W/ TOMO AND CAD
6 of 10 series · 6 of 30 positions shown · non-contrast
Comparison: Previous exam(s).

CLINICAL DATA: Screening.

EXAM:
DIGITAL SCREENING BILATERAL MAMMOGRAM WITH TOMOSYNTHESIS AND CAD
TECHNIQUE: Bilateral screening digital craniocaudal and mediolateral oblique
mammograms were obtained. Bilateral screening digital breast
tomosynthesis was performed. The images were evaluated with
computer-aided detection.

[L CC synth-2D (1 of 2)]
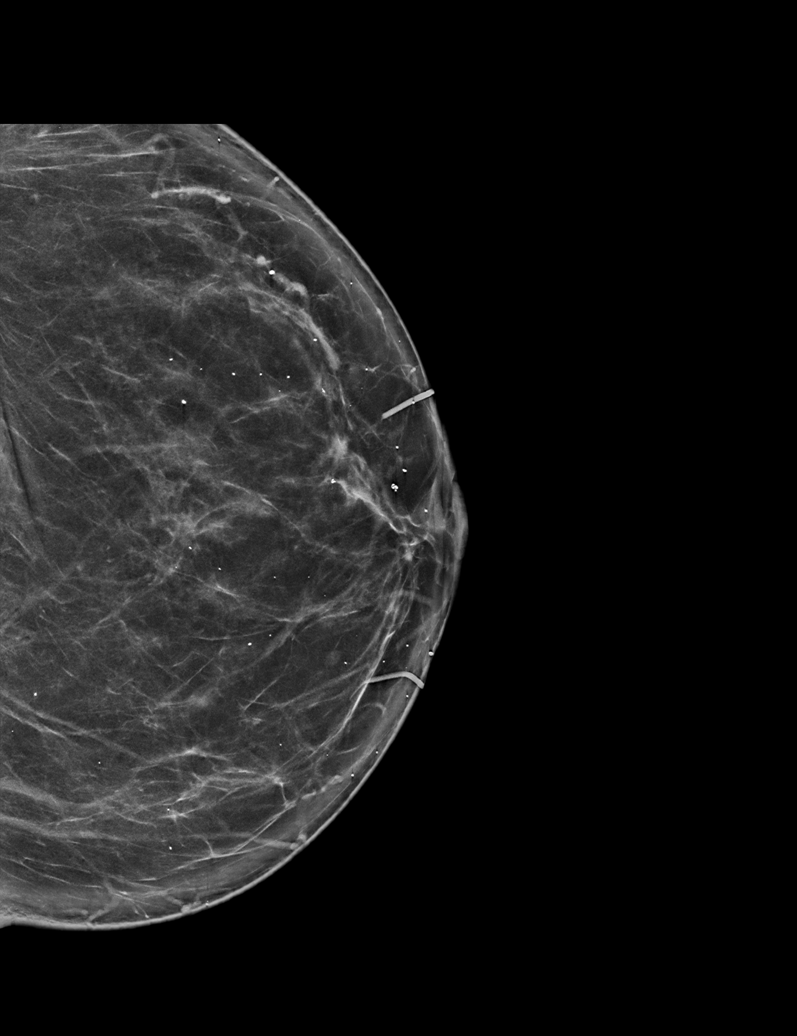

[R MLO synth-2D]
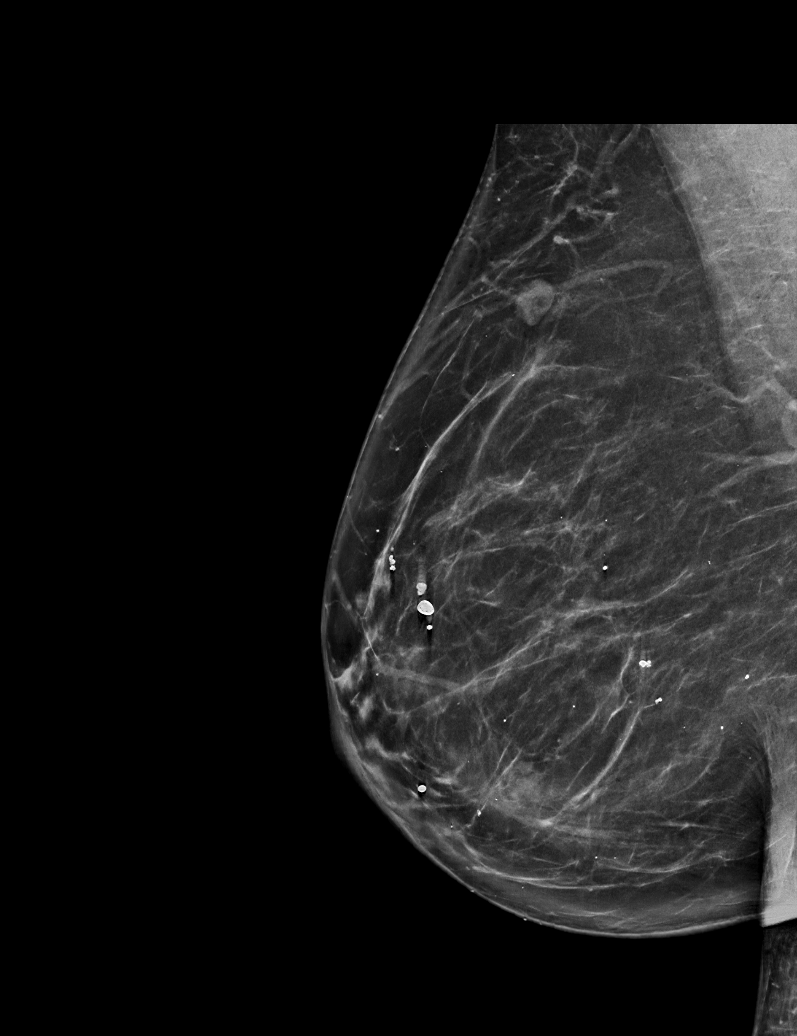

[L MLO synth-2D]
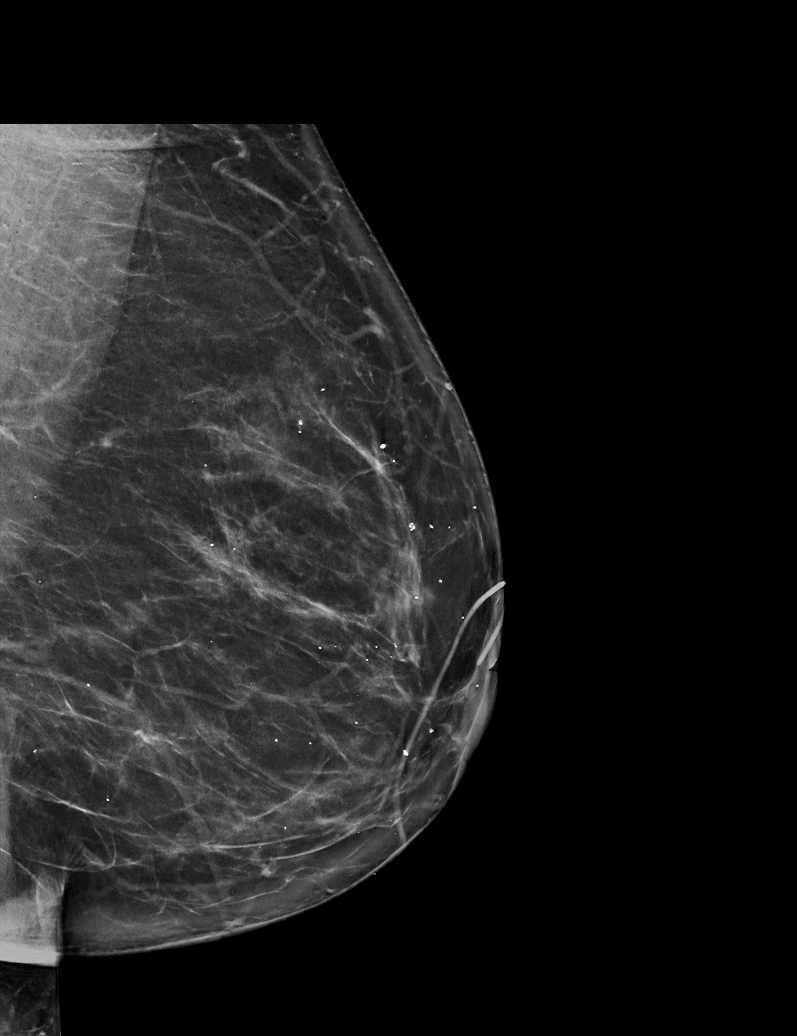

[R CC synth-2D]
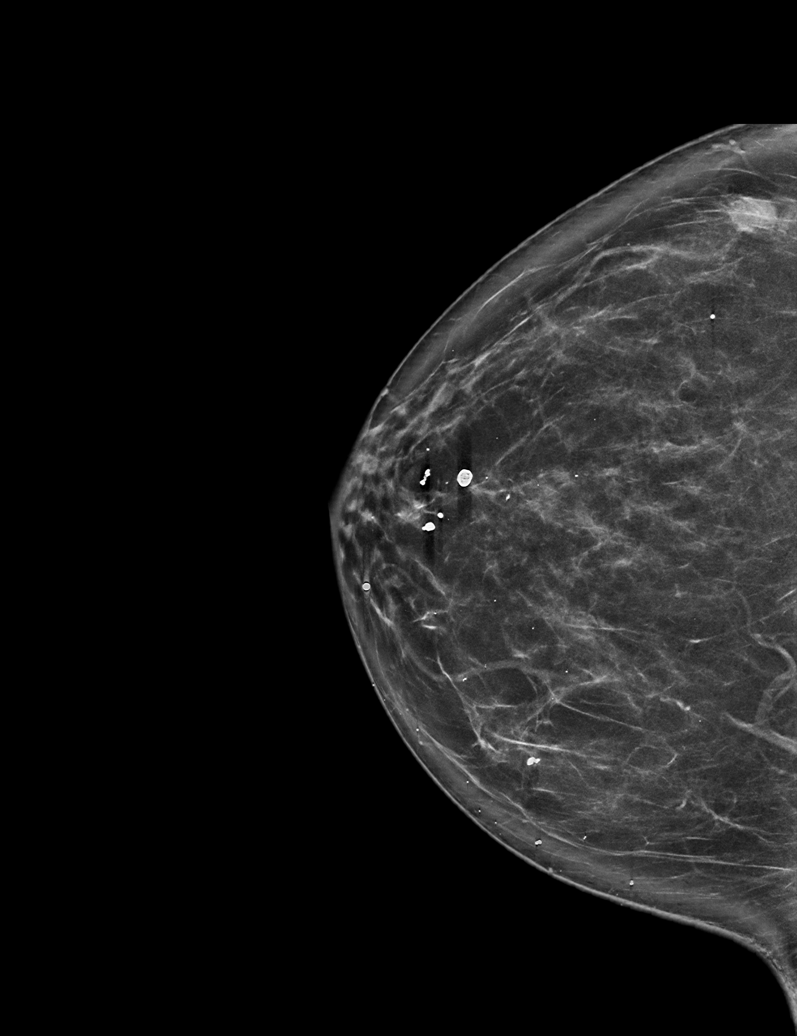

[L CC synth-2D (2 of 2)]
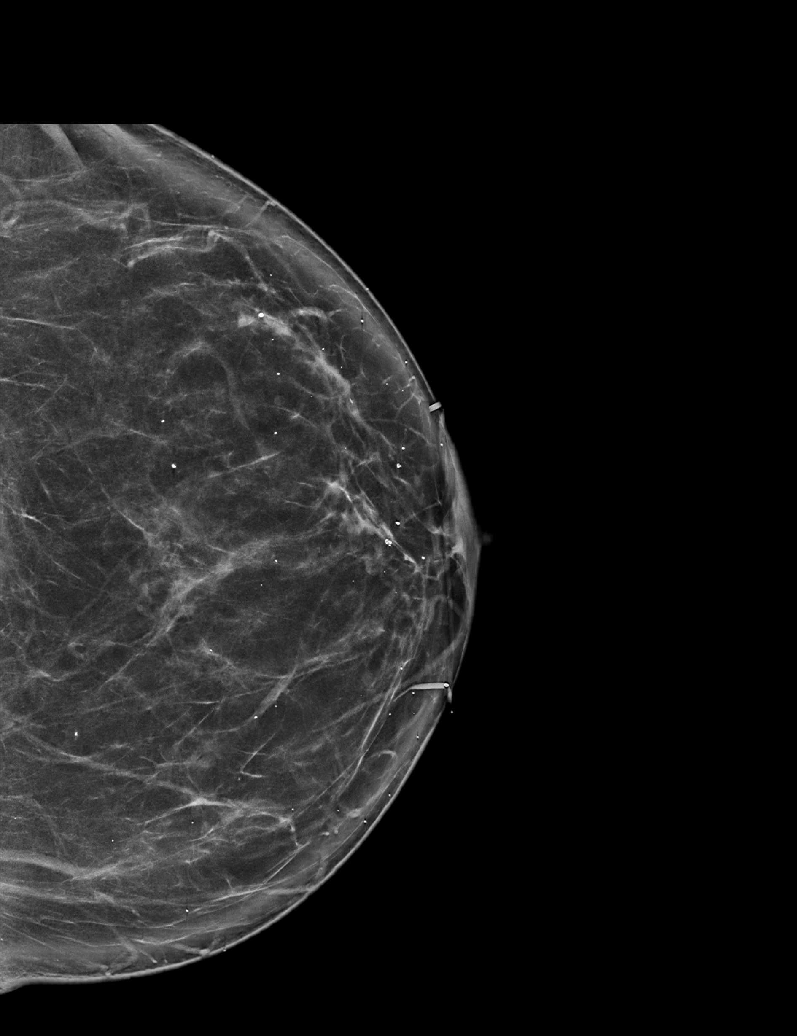

[L MLO tomo · tomo slice 39/78.0]
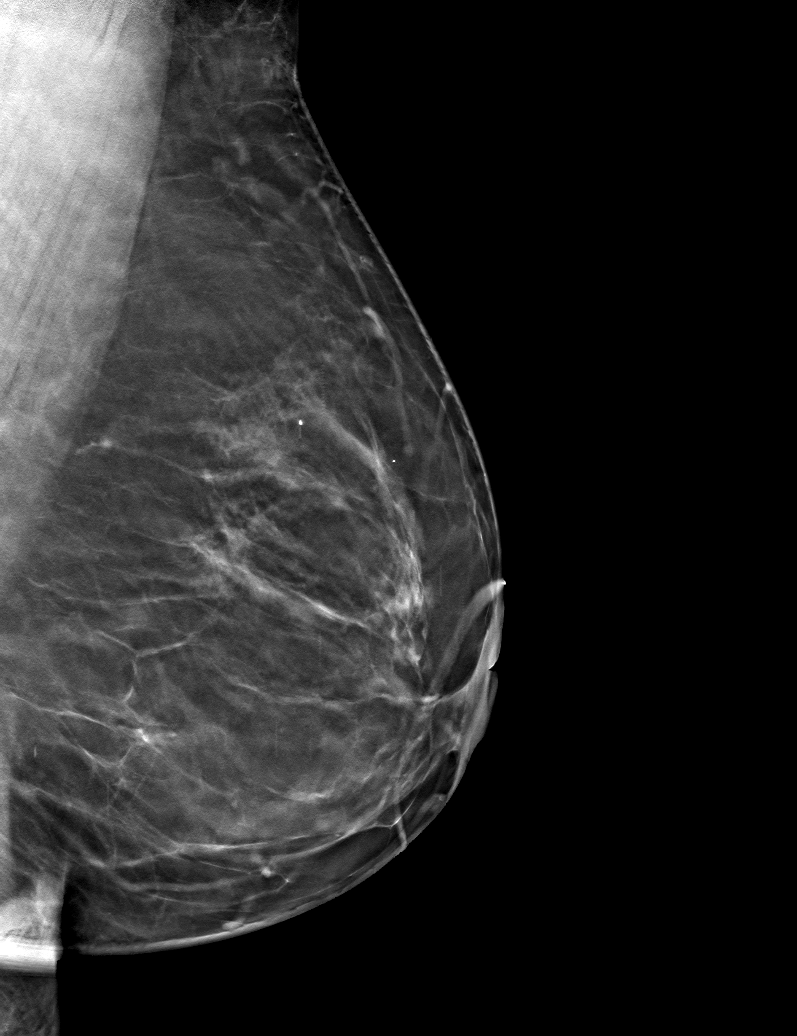

[6 of 30 positions shown; findings below may reference images not displayed]

ACR Breast Density Category b: There are scattered areas of
fibroglandular density.
FINDINGS: There are no findings suspicious for malignancy.
IMPRESSION: No mammographic evidence of malignancy. A result letter of this
screening mammogram will be mailed directly to the patient.

RECOMMENDATION:
Screening mammogram in one year. (Code:51-O-LD2)

BI-RADS CATEGORY  1: Negative.

## 2023-09-20 ENCOUNTER — Ambulatory Visit
Admission: RE | Admit: 2023-09-20 | Discharge: 2023-09-20 | Disposition: A | Discharge status: Home / Self Care | Payer: Medicare PPO | Source: Ambulatory Visit | Attending: Family Medicine

## 2023-09-20 DIAGNOSIS — Z1231 Encounter for screening mammogram for malignant neoplasm of breast: Secondary | ICD-10-CM

## 2023-09-23 DIAGNOSIS — M7542 Impingement syndrome of left shoulder: Secondary | ICD-10-CM | POA: Diagnosis not present

## 2023-09-23 DIAGNOSIS — M7541 Impingement syndrome of right shoulder: Secondary | ICD-10-CM | POA: Diagnosis not present

## 2023-09-25 DIAGNOSIS — M7542 Impingement syndrome of left shoulder: Secondary | ICD-10-CM | POA: Diagnosis not present

## 2023-09-28 ENCOUNTER — Encounter: Payer: Self-pay | Admitting: Internal Medicine

## 2023-09-28 ENCOUNTER — Telehealth: Payer: Self-pay | Admitting: *Deleted

## 2023-09-28 ENCOUNTER — Ambulatory Visit: Payer: Medicare PPO | Attending: Internal Medicine | Admitting: Internal Medicine

## 2023-09-28 VITALS — BP 142/84 | HR 65 | Ht 65.0 in | Wt 164.4 lb

## 2023-09-28 DIAGNOSIS — R55 Syncope and collapse: Secondary | ICD-10-CM | POA: Diagnosis not present

## 2023-09-28 DIAGNOSIS — R0683 Snoring: Secondary | ICD-10-CM | POA: Diagnosis not present

## 2023-09-28 NOTE — Telephone Encounter (Signed)
Patient agreement reviewed and signed on 09/28/2023.  WatchPAT issued to patient on 09/28/2023 by Dennis Bast, RN. Patient aware to not open the WatchPAT box until contacted with the activation PIN. Patient profile initialized in CloudPAT on 09/28/2023 by Salli Real, RN. Device serial number: [id number]  Please list Reason for Call as Advice Only and type "WatchPAT issued to patient" in the comment box.

## 2023-09-28 NOTE — Progress Notes (Signed)
Cardiology Office Note   Date:  09/28/2023   ID:  Gabrielle [name], DOB [birth year], MRN [id number]  PCP:  Aliene Beams, MD  Cardiologist:   Dietrich Pates, MD   Pt referred for CP and palpitations     History of Present Illness: Gabrielle [name] is a 77 y.o. female with a history of HTN, hypothyroidism, HL   The pt was seen by Dr Tracie Harrier in Oct 2024  Pt had a several month hx of CP with activity   She says at times spells would "take her breath way"   Last 1 to 2 min  Associated with some SOB   The pt also says at times (not with CP) she felt her heart was beating a little funny    Fluttering  Denied dizzienss    The pt says since seen in Oct she hasn't had any spells  recently    Also hasn't had fluttering either    The pt is [employer]  5 to 9 AM and then 2 to 5 pm   Goes to bed at 8:30  Wakes up at 3:30     She denies CP  Breathing is good   Does 7000 steps several times per week     No change in ability to do things  Recovered from bilateral knee replacements  BP usually 147-152/78  when she takes it on getting up   later in day she say it is 110s to 120s/  Hx of OSA   Did wear CPAP in the past   Stopped   about 10 years ago      Current Meds  Medication Sig   acetaminophen (TYLENOL) 500 MG tablet Take 500 mg by mouth as needed for moderate pain.   amLODipine (NORVASC) 5 MG tablet Take 5 mg by mouth daily.   ezetimibe (ZETIA) 10 MG tablet Take 10 mg by mouth daily.   lactose free nutrition (BOOST) LIQD Take 237 mLs by mouth 2 (two) times daily between meals.   levothyroxine (SYNTHROID, LEVOTHROID) 50 MCG tablet Take 50 mcg by mouth daily before breakfast.   polyethylene glycol (MIRALAX / GLYCOLAX) 17 g packet Take 17 g by mouth daily as needed for mild constipation.   potassium chloride (KLOR-CON) 10 MEQ tablet Take 10 mEq by mouth every morning.   raloxifene (EVISTA) 60 MG tablet Take 60 mg by mouth every morning.   rosuvastatin (CRESTOR) 5 MG tablet Take 5 mg by mouth 3  (three) times a week.   ZYRTEC ALLERGY 10 MG tablet Take 10 mg by mouth daily.   [DISCONTINUED] HYDROcodone-acetaminophen (NORCO/VICODIN) 5-325 MG tablet Take 1-2 tablets by mouth every 6 (six) hours as needed for severe pain.   [DISCONTINUED] methocarbamol (ROBAXIN) 500 MG tablet Take 1 tablet (500 mg total) by mouth every 6 (six) hours as needed for muscle spasms.   [DISCONTINUED] traMADol (ULTRAM) 50 MG tablet Take 1-2 tablets (50-100 mg total) by mouth every 6 (six) hours as needed for moderate pain.     Allergies:   Lidocaine, Aspirin, Hylan g-f 20, Lisinopril, Losartan, Metoprolol, Olmesartan, Codeine, Latex, Sulfa antibiotics, and Wound dressing adhesive   Past Medical History:  Diagnosis Date   Arthritis    Asthma    childhood   Clicking tinnitus of right ear    Constipation    Estrogen deficiency    GERD (gastroesophageal reflux disease)    Glaucoma    Hypercalcemia    Hypercholesterolemia    Hypertension  Hypothyroidism    Insomnia    Menopausal symptom    Osteoarthritis of knee    Osteopenia    Pain, joint, hand, right    Paresthesia    Sleep apnea    not used in 7-8 years per pt   Thyroid disease    Trigger finger     Past Surgical History:  Procedure Laterality Date   ABDOMINAL HYSTERECTOMY     bilateral cataract surgery      03/2022   BREAST EXCISIONAL BIOPSY Left    CLOSED REDUCTION MANDIBLE WITH MANDIBULOMA N/A 04/05/2022   Procedure: CLOSED REDUCTION MANDIBLE WITH MANDIBULOMAXILLARY FUSION;  Surgeon: Exie Parody, DMD;  Location: [facility] OR;  Service: Oral Surgery;  Laterality: N/A;   dental implants      x 3 on 09/29/22   FOOT SURGERY  2019   Arch reconstruction   GLAUCOMA SURGERY     at same time as cataract surgery   SHOULDER SURGERY Right    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   Procedure: TOTAL KNEE ARTHROPLASTY;  Surgeon: Ollen Gross, MD;  Location: [facility] ORS;  Service: Orthopedics;  Laterality: Right;   TOTAL KNEE ARTHROPLASTY Left  01/25/2023   Procedure: TOTAL KNEE ARTHROPLASTY;  Surgeon: Ollen Gross, MD;  Location: [facility] ORS;  Service: Orthopedics;  Laterality: Left;     Social History:  The patient  reports that she has never smoked. She has never used smokeless tobacco. She reports that she does not drink alcohol and does not use drugs.   Family History:  The patient's family history includes Cancer in her father; Diabetes in her father; Hypertension in her father.    ROS:  Please see the history of present illness. All other systems are reviewed and  Negative to the above problem except as noted.    PHYSICAL EXAM: VS:  BP (!) 142/84   Pulse 65   Ht 5\' 5"  (1.651 m)   Wt 164 lb 6.4 oz (74.6 kg)   SpO2 97%   BMI 27.36 kg/m   GEN: Well nourished, well developed, in no acute distress  HEENT: normal  Neck: no JVD, carotid bruits, Cardiac: RRR; no murmurs, Respiratory:  clear to auscultation  GI: soft, nontender, No hepatomegaly  MS: no deformity Moving all extremities     EKG:  EKG is ordered today.  SR      Lipid Panel No results found for: "CHOL", "TRIG", "HDL", "CHOLHDL", "VLDL", "LDLCALC", "LDLDIRECT"    Wt Readings from Last 3 Encounters:  09/28/23 164 lb 6.4 oz (74.6 kg)  01/25/23 145 lb 8.1 oz (66 kg)  01/12/23 147 lb (66.7 kg)      ASSESSMENT AND PLAN:  1  Chest pain  Spells atypical  Sharp, short duration.  Not associated with activity   I do not think cardiac     They have resolved   2  CAD   Ptwith CAD on CT scan of chest 1 year ago    Rx risk factors   3  HTN  BP is high on waking  She takes amlodipine at about 8:30   She says better at other times    She did have Dx of OSA in past   This would be important to redocument/clarify      She has multiple drug intolerances  Was on a diuretic in the past   Stopped by Dr Roanna Raider   Will work to get records   4  HL   Last LDL 98  HDL 69  Will set up for lipoomed and Lpa and ApoB  Follow up based on test results      Current medicines  are reviewed at length with the patient today.  The patient does not have concerns regarding medicines.  Signed, Dietrich Pates, MD  09/28/2023 8:13 AM    [facility] [address] [place], [place], [place]  [address] Phone: [phone]; Fax: [phone]

## 2023-09-28 NOTE — Patient Instructions (Addendum)
Medication Instructions:  Your physician recommends that you continue on your current medications as directed. Please refer to the Current Medication list given to you today.  *If you need a refill on your cardiac medications before your next appointment, please call your pharmacy*  Lab Work: TODAY: Lipomed, Apo B, LP(a) If you have labs (blood work) drawn today and your tests are completely normal, you will receive your results only by: MyChart Message (if you have MyChart) OR A paper copy in the mail If you have any lab test that is abnormal or we need to change your treatment, we will call you to review the results.  Testing/Procedures: Your physician has requested you have an Itamar sleep study performed.  Follow-Up: At [facility], you and your health needs are our priority.  As part of our continuing mission to provide you with exceptional heart care, we have created designated Provider Care Teams.  These Care Teams include your primary Cardiologist (physician) and Advanced Practice Providers (APPs -  Physician Assistants and Nurse Practitioners) who all work together to provide you with the care you need, when you need it.  We recommend signing up for the patient portal called "MyChart".  Sign up information is provided on this After Visit Summary.  MyChart is used to connect with patients for Virtual Visits (Telemedicine).  Patients are able to view lab/test results, encounter notes, upcoming appointments, etc.  Non-urgent messages can be sent to your provider as well.   To learn more about what you can do with MyChart, go to [email or web address].    Your next appointment:   1 year(s)  The format for your next appointment:   In Person  Provider:   Dietrich Pates, MD {

## 2023-10-15 DIAGNOSIS — M7671 Peroneal tendinitis, right leg: Secondary | ICD-10-CM | POA: Diagnosis not present

## 2023-10-15 DIAGNOSIS — M7751 Other enthesopathy of right foot: Secondary | ICD-10-CM | POA: Diagnosis not present

## 2023-10-21 NOTE — Telephone Encounter (Signed)
Patient stated she received her equipment but never received the code and is following up on next steps.

## 2023-10-25 DIAGNOSIS — M25812 Other specified joint disorders, left shoulder: Secondary | ICD-10-CM | POA: Diagnosis not present

## 2023-10-25 DIAGNOSIS — M7542 Impingement syndrome of left shoulder: Secondary | ICD-10-CM | POA: Diagnosis not present

## 2023-10-25 DIAGNOSIS — M7541 Impingement syndrome of right shoulder: Secondary | ICD-10-CM | POA: Diagnosis not present

## 2023-10-25 NOTE — Telephone Encounter (Signed)
Ordering provider: Tenny Craw Associated diagnoses: Snoring WatchPAT PA obtained on 10/25/2023 by Brunetta Genera, LPN. Authorization: Yes; tracking ID [id number] Patient notified of PIN (1234) on 10/25/2023 via Notification Method: MyChart message and Voicemail Message.

## 2023-10-30 ENCOUNTER — Encounter (INDEPENDENT_AMBULATORY_CARE_PROVIDER_SITE_OTHER): Payer: Self-pay | Admitting: Cardiology

## 2023-10-30 DIAGNOSIS — G4733 Obstructive sleep apnea (adult) (pediatric): Secondary | ICD-10-CM

## 2023-10-30 DIAGNOSIS — R0683 Snoring: Secondary | ICD-10-CM | POA: Diagnosis not present

## 2023-11-02 ENCOUNTER — Ambulatory Visit: Payer: Medicare PPO | Attending: Internal Medicine

## 2023-11-02 DIAGNOSIS — R0683 Snoring: Secondary | ICD-10-CM

## 2023-11-02 NOTE — Procedures (Signed)
   SLEEP STUDY REPORT Patient Information Study Date: 10/30/2023 Patient Name: Gabrielle [name] Patient ID: [id number] Birth Date: Jan 04, 1946 Age: 78 Gender: Female BMI: 27.2 (W=163 lb, H=5' 5'') Stopbang: 4 Referring Physician: Dietrich Pates, MD  TEST DESCRIPTION: Home sleep apnea testing was completed using the WatchPat, a Type 1 device, utilizing  peripheral arterial tonometry (PAT), chest movement, actigraphy, pulse oximetry, pulse rate, body position and snore.  AHI was calculated with apnea and hypopnea using valid sleep time as the denominator. RDI includes apneas,  hypopneas, and RERAs. The data acquired and the scoring of sleep and all associated events were performed in  accordance with the recommended standards and specifications as outlined in the AASM Manual for the Scoring of  Sleep and Associated Events 2.2.0 (2015).   FINDINGS: 1. No evidence of Obstructive Sleep Apnea with AHI 0.4/hr.  2. No Central Sleep Apnea. 3. Oxygen desaturations as low as 90%. 4. Mild to moderate snoring was present. O2 sats were < 88% for 0 minutes. 5. Total sleep time was 9 hrs and 17 min. 6. 31.1% of total sleep time was spent in REM sleep.  7. Shortened sleep onset latency at 6 min.  8. Shortened REM sleep onset latency at 49 min.  9. Total awakenings were 6.   DIAGNOSIS:  Normal study with no significant sleep disordered breathing.  RECOMMENDATIONS: 1. Normal study with no significant sleep disordered breathing.  2. Healthy sleep recommendations include: adequate nightly sleep (normal 7-9 hrs/night), avoidance of caffeine after  noon and alcohol near bedtime, and maintaining a sleep environment that is cool, dark and quiet.  3. Weight loss for overweight patients is recommended.   4. Snoring recommendations include: weight loss where appropriate, side sleeping, and avoidance of alcohol before  bed.  5. Operation of motor vehicle or dangerous equipment must be avoided when feeling  drowsy, excessively sleepy, or  mentally fatigued.   6. An ENT consultation which may be useful for specific causes of and possible treatment of bothersome snoring .   7. Weight loss may be of benefit in reducing the severity of snoring.   Signature: Armanda Magic, MD; [facility]; Diplomat, American Board of Sleep  Medicine Electronically Signed: 11/02/2023 12:00:18 PM

## 2023-11-02 NOTE — Progress Notes (Signed)
No obstructive sleep apnea Have records come from PCP   She has had multiple medicine intolerances in the past

## 2023-11-03 ENCOUNTER — Telehealth: Payer: Self-pay

## 2023-11-03 NOTE — Telephone Encounter (Signed)
-----   Message from Armanda Magic sent at 11/02/2023 12:01 PM EST ----- Please let patient know that sleep study showed no significant sleep apnea.

## 2023-11-03 NOTE — Telephone Encounter (Signed)
  No obstructive sleep apnea Have records come from PCP   She has had multiple medicine intolerances in the past     Pt advised and records requested from her PCP.

## 2023-11-03 NOTE — Telephone Encounter (Signed)
-----   Message from Dietrich Pates sent at 11/02/2023 10:43 PM EST -----    ----- Message ----- From: Quintella Reichert, MD Sent: 11/02/2023  12:01 PM EST To: Pricilla Riffle, MD  No OSA

## 2023-11-03 NOTE — Telephone Encounter (Signed)
Notified patient of sleep study results and recommendations, All question were answered and patient verbalized understanding.

## 2023-11-17 DIAGNOSIS — H402232 Chronic angle-closure glaucoma, bilateral, moderate stage: Secondary | ICD-10-CM | POA: Diagnosis not present

## 2023-11-17 DIAGNOSIS — H04129 Dry eye syndrome of unspecified lacrimal gland: Secondary | ICD-10-CM | POA: Diagnosis not present

## 2023-11-19 DIAGNOSIS — H02834 Dermatochalasis of left upper eyelid: Secondary | ICD-10-CM | POA: Diagnosis not present

## 2023-11-19 DIAGNOSIS — H402231 Chronic angle-closure glaucoma, bilateral, mild stage: Secondary | ICD-10-CM | POA: Diagnosis not present

## 2023-11-19 DIAGNOSIS — H02423 Myogenic ptosis of bilateral eyelids: Secondary | ICD-10-CM | POA: Diagnosis not present

## 2023-11-19 DIAGNOSIS — H02831 Dermatochalasis of right upper eyelid: Secondary | ICD-10-CM | POA: Diagnosis not present

## 2023-11-19 DIAGNOSIS — H57813 Brow ptosis, bilateral: Secondary | ICD-10-CM | POA: Diagnosis not present

## 2024-01-03 DIAGNOSIS — M65331 Trigger finger, right middle finger: Secondary | ICD-10-CM | POA: Diagnosis not present

## 2024-01-03 DIAGNOSIS — M65341 Trigger finger, right ring finger: Secondary | ICD-10-CM | POA: Diagnosis not present

## 2024-01-06 DIAGNOSIS — M7541 Impingement syndrome of right shoulder: Secondary | ICD-10-CM | POA: Diagnosis not present

## 2024-01-06 DIAGNOSIS — M7542 Impingement syndrome of left shoulder: Secondary | ICD-10-CM | POA: Diagnosis not present

## 2024-01-17 DIAGNOSIS — R32 Unspecified urinary incontinence: Secondary | ICD-10-CM | POA: Diagnosis not present

## 2024-01-17 DIAGNOSIS — Z833 Family history of diabetes mellitus: Secondary | ICD-10-CM | POA: Diagnosis not present

## 2024-01-17 DIAGNOSIS — K5900 Constipation, unspecified: Secondary | ICD-10-CM | POA: Diagnosis not present

## 2024-01-17 DIAGNOSIS — M1990 Unspecified osteoarthritis, unspecified site: Secondary | ICD-10-CM | POA: Diagnosis not present

## 2024-01-17 DIAGNOSIS — H2513 Age-related nuclear cataract, bilateral: Secondary | ICD-10-CM | POA: Diagnosis not present

## 2024-01-17 DIAGNOSIS — E876 Hypokalemia: Secondary | ICD-10-CM | POA: Diagnosis not present

## 2024-01-17 DIAGNOSIS — Z8249 Family history of ischemic heart disease and other diseases of the circulatory system: Secondary | ICD-10-CM | POA: Diagnosis not present

## 2024-01-17 DIAGNOSIS — I10 Essential (primary) hypertension: Secondary | ICD-10-CM | POA: Diagnosis not present

## 2024-01-17 DIAGNOSIS — E785 Hyperlipidemia, unspecified: Secondary | ICD-10-CM | POA: Diagnosis not present

## 2024-01-17 DIAGNOSIS — M810 Age-related osteoporosis without current pathological fracture: Secondary | ICD-10-CM | POA: Diagnosis not present

## 2024-03-10 DIAGNOSIS — R058 Other specified cough: Secondary | ICD-10-CM | POA: Diagnosis not present

## 2024-03-10 DIAGNOSIS — S336XXA Sprain of sacroiliac joint, initial encounter: Secondary | ICD-10-CM | POA: Diagnosis not present

## 2024-04-05 DIAGNOSIS — M7542 Impingement syndrome of left shoulder: Secondary | ICD-10-CM | POA: Diagnosis not present

## 2024-05-09 DIAGNOSIS — L600 Ingrowing nail: Secondary | ICD-10-CM | POA: Diagnosis not present

## 2024-05-09 DIAGNOSIS — M2041 Other hammer toe(s) (acquired), right foot: Secondary | ICD-10-CM | POA: Diagnosis not present

## 2024-05-09 DIAGNOSIS — M2042 Other hammer toe(s) (acquired), left foot: Secondary | ICD-10-CM | POA: Diagnosis not present

## 2024-05-09 DIAGNOSIS — L84 Corns and callosities: Secondary | ICD-10-CM | POA: Diagnosis not present

## 2024-05-10 DIAGNOSIS — R7303 Prediabetes: Secondary | ICD-10-CM | POA: Diagnosis not present

## 2024-05-10 DIAGNOSIS — E559 Vitamin D deficiency, unspecified: Secondary | ICD-10-CM | POA: Diagnosis not present

## 2024-05-10 DIAGNOSIS — Z1331 Encounter for screening for depression: Secondary | ICD-10-CM | POA: Diagnosis not present

## 2024-05-10 DIAGNOSIS — R1013 Epigastric pain: Secondary | ICD-10-CM | POA: Diagnosis not present

## 2024-05-10 DIAGNOSIS — R053 Chronic cough: Secondary | ICD-10-CM | POA: Diagnosis not present

## 2024-05-10 DIAGNOSIS — J40 Bronchitis, not specified as acute or chronic: Secondary | ICD-10-CM | POA: Diagnosis not present

## 2024-05-10 DIAGNOSIS — E7800 Pure hypercholesterolemia, unspecified: Secondary | ICD-10-CM | POA: Diagnosis not present

## 2024-05-10 DIAGNOSIS — Z0000 Encounter for general adult medical examination without abnormal findings: Secondary | ICD-10-CM | POA: Diagnosis not present

## 2024-05-10 DIAGNOSIS — E538 Deficiency of other specified B group vitamins: Secondary | ICD-10-CM | POA: Diagnosis not present

## 2024-05-10 DIAGNOSIS — I10 Essential (primary) hypertension: Secondary | ICD-10-CM | POA: Diagnosis not present

## 2024-05-10 DIAGNOSIS — E039 Hypothyroidism, unspecified: Secondary | ICD-10-CM | POA: Diagnosis not present

## 2024-05-29 ENCOUNTER — Other Ambulatory Visit: Payer: Self-pay | Admitting: Family Medicine

## 2024-05-29 ENCOUNTER — Ambulatory Visit
Admission: RE | Admit: 2024-05-29 | Discharge: 2024-05-29 | Disposition: A | Discharge status: Home / Self Care | Source: Ambulatory Visit | Attending: Family Medicine

## 2024-05-29 DIAGNOSIS — R053 Chronic cough: Secondary | ICD-10-CM | POA: Diagnosis not present

## 2024-06-12 DIAGNOSIS — H57813 Brow ptosis, bilateral: Secondary | ICD-10-CM | POA: Diagnosis not present

## 2024-06-12 DIAGNOSIS — H02831 Dermatochalasis of right upper eyelid: Secondary | ICD-10-CM | POA: Diagnosis not present

## 2024-06-12 DIAGNOSIS — H02423 Myogenic ptosis of bilateral eyelids: Secondary | ICD-10-CM | POA: Diagnosis not present

## 2024-06-12 DIAGNOSIS — H02834 Dermatochalasis of left upper eyelid: Secondary | ICD-10-CM | POA: Diagnosis not present

## 2024-06-12 DIAGNOSIS — H402231 Chronic angle-closure glaucoma, bilateral, mild stage: Secondary | ICD-10-CM | POA: Diagnosis not present

## 2024-07-03 DIAGNOSIS — M65331 Trigger finger, right middle finger: Secondary | ICD-10-CM | POA: Diagnosis not present

## 2024-07-11 DIAGNOSIS — M7542 Impingement syndrome of left shoulder: Secondary | ICD-10-CM | POA: Diagnosis not present

## 2024-07-11 DIAGNOSIS — M7541 Impingement syndrome of right shoulder: Secondary | ICD-10-CM | POA: Diagnosis not present

## 2024-07-12 DIAGNOSIS — Z23 Encounter for immunization: Secondary | ICD-10-CM | POA: Diagnosis not present

## 2024-07-12 DIAGNOSIS — R1013 Epigastric pain: Secondary | ICD-10-CM | POA: Diagnosis not present

## 2024-07-12 DIAGNOSIS — R053 Chronic cough: Secondary | ICD-10-CM | POA: Diagnosis not present

## 2024-07-20 DIAGNOSIS — K5730 Diverticulosis of large intestine without perforation or abscess without bleeding: Secondary | ICD-10-CM | POA: Diagnosis not present

## 2024-07-20 DIAGNOSIS — Z860100 Personal history of colon polyps, unspecified: Secondary | ICD-10-CM | POA: Diagnosis not present

## 2024-07-20 DIAGNOSIS — K219 Gastro-esophageal reflux disease without esophagitis: Secondary | ICD-10-CM | POA: Diagnosis not present

## 2024-07-20 DIAGNOSIS — K5900 Constipation, unspecified: Secondary | ICD-10-CM | POA: Diagnosis not present

## 2024-08-03 DIAGNOSIS — M25552 Pain in left hip: Secondary | ICD-10-CM | POA: Diagnosis not present

## 2024-08-03 DIAGNOSIS — M25551 Pain in right hip: Secondary | ICD-10-CM | POA: Diagnosis not present

## 2024-08-11 DIAGNOSIS — H04129 Dry eye syndrome of unspecified lacrimal gland: Secondary | ICD-10-CM | POA: Diagnosis not present

## 2024-08-11 DIAGNOSIS — H402232 Chronic angle-closure glaucoma, bilateral, moderate stage: Secondary | ICD-10-CM | POA: Diagnosis not present

## 2024-08-16 DIAGNOSIS — L84 Corns and callosities: Secondary | ICD-10-CM | POA: Diagnosis not present

## 2024-08-16 DIAGNOSIS — M19072 Primary osteoarthritis, left ankle and foot: Secondary | ICD-10-CM | POA: Diagnosis not present

## 2024-08-16 DIAGNOSIS — M2041 Other hammer toe(s) (acquired), right foot: Secondary | ICD-10-CM | POA: Diagnosis not present

## 2024-08-16 DIAGNOSIS — M2011 Hallux valgus (acquired), right foot: Secondary | ICD-10-CM | POA: Diagnosis not present

## 2024-08-16 DIAGNOSIS — M24574 Contracture, right foot: Secondary | ICD-10-CM | POA: Diagnosis not present

## 2024-08-16 DIAGNOSIS — M2042 Other hammer toe(s) (acquired), left foot: Secondary | ICD-10-CM | POA: Diagnosis not present

## 2024-08-16 DIAGNOSIS — M792 Neuralgia and neuritis, unspecified: Secondary | ICD-10-CM | POA: Diagnosis not present

## 2024-08-16 DIAGNOSIS — M19071 Primary osteoarthritis, right ankle and foot: Secondary | ICD-10-CM | POA: Diagnosis not present

## 2024-08-21 ENCOUNTER — Other Ambulatory Visit: Payer: Self-pay | Admitting: Family Medicine

## 2024-08-21 DIAGNOSIS — Z1231 Encounter for screening mammogram for malignant neoplasm of breast: Secondary | ICD-10-CM

## 2024-08-25 ENCOUNTER — Ambulatory Visit

## 2024-08-25 VITALS — BP 120/64 | HR 84 | Temp 97.8°F | Ht 65.0 in | Wt 170.6 lb

## 2024-08-25 DIAGNOSIS — R918 Other nonspecific abnormal finding of lung field: Secondary | ICD-10-CM

## 2024-08-25 DIAGNOSIS — R053 Chronic cough: Secondary | ICD-10-CM

## 2024-08-25 DIAGNOSIS — Z9109 Other allergy status, other than to drugs and biological substances: Secondary | ICD-10-CM | POA: Diagnosis not present

## 2024-08-25 LAB — CBC WITH DIFFERENTIAL/PLATELET
Basophils Absolute: 0 K/uL (ref 0.0–0.1)
Basophils Relative: 0.4 % (ref 0.0–3.0)
Eosinophils Absolute: 0.1 K/uL (ref 0.0–0.7)
Eosinophils Relative: 1.4 % (ref 0.0–5.0)
HCT: 41.4 % (ref 36.0–46.0)
Hemoglobin: 13.9 g/dL (ref 12.0–15.0)
Lymphocytes Relative: 40.5 % (ref 12.0–46.0)
Lymphs Abs: 1.6 K/uL (ref 0.7–4.0)
MCHC: 33.7 g/dL (ref 30.0–36.0)
MCV: 94.3 fl (ref 78.0–100.0)
Monocytes Absolute: 0.4 K/uL (ref 0.1–1.0)
Monocytes Relative: 10.7 % (ref 3.0–12.0)
Neutro Abs: 1.9 K/uL (ref 1.4–7.7)
Neutrophils Relative %: 47 % (ref 43.0–77.0)
Platelets: 185 K/uL (ref 150.0–400.0)
RBC: 4.38 Mil/uL (ref 3.87–5.11)
RDW: 13.2 % (ref 11.5–15.5)
WBC: 4 K/uL (ref 4.0–10.5)

## 2024-08-25 LAB — BRAIN NATRIURETIC PEPTIDE: Pro B Natriuretic peptide (BNP): 13 pg/mL (ref 0.0–100.0)

## 2024-08-25 NOTE — Progress Notes (Signed)
 New Patient Pulmonology Office Visit   Subjective:  Patient ID: Gabrielle [name], female    DOB: May 08, 1946  MRN: [id number]  Referred by: Rolinda Millman, MD  CC:  Chief Complaint  Patient presents with   Cough    Cough x 8 months    HPI Gabrielle [name] is a 78 y.o. female who is referred to this clinic for chronic cough.  Discussed the use of AI scribe software for clinical note transcription with the patient, who gave verbal consent to proceed.  History of Present Illness Gabrielle [name] is a 78 year old female with asthma and allergies who presents with a chronic cough.  She has experienced a chronic cough for over a year, initially noticed by a coworker in December 2023. The cough is productive of yellowish sputum, occasionally greenish, and is worse in the mornings. No wheezing, fever, chills, weight loss, or sinus congestion. 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  Her asthma history includes her last attack at age 37, with no recent use of inhalers. She takes Zyrtec daily for allergies and uses Flonase as needed, though she has run out. She denies any family history of lung problems.  A CT scan in July 2023 showed haziness and reticular changes/atelectasis in the lower lungs. An x-ray in August 2025 was mostly clear. She takes omeprazole for acid reflux, which initially worsened her symptoms but is now improving. She also takes potassium, an allergy  tablet, ezetimibe , and rosuvastatin  three times a week for cholesterol.  She has a history of double knee replacements and experiences occasional leg swelling when not wearing compression stockings. She denies any history of pneumonia. Denies autoimmune disease     ROS Review of symptoms negative except mentioned above   Allergies: Lidocaine , Aspirin, Hylan g-f 20, Lisinopril, Losartan, Metoprolol, Olmesartan, Codeine, Latex, Sulfa antibiotics, and Wound  dressing adhesive  Current Outpatient Medications:    acetaminophen  (TYLENOL ) 500 MG tablet, Take 500 mg by mouth as needed for moderate pain., Disp: , Rfl:    amLODipine  (NORVASC ) 5 MG tablet, Take 5 mg by mouth daily., Disp: , Rfl:    ezetimibe  (ZETIA ) 10 MG tablet, Take 10 mg by mouth daily., Disp: , Rfl:    lactose free nutrition (BOOST) LIQD, Take 237 mLs by mouth 2 (two) times daily between meals., Disp: , Rfl:    levothyroxine  (SYNTHROID , LEVOTHROID) 50 MCG tablet, Take 50 mcg by mouth daily before breakfast., Disp: , Rfl:    polyethylene glycol (MIRALAX  / GLYCOLAX ) 17 g packet, Take 17 g by mouth daily as needed for mild constipation., Disp: 30 each, Rfl: 0   potassium chloride  (KLOR-CON ) 10 MEQ tablet, Take 10 mEq by mouth every morning., Disp: , Rfl:    raloxifene  (EVISTA ) 60 MG tablet, Take 60 mg by mouth every morning., Disp: , Rfl:    rosuvastatin  (CRESTOR ) 5 MG tablet, Take 5 mg by mouth 3 (three) times a week., Disp: , Rfl:    ZYRTEC ALLERGY  10 MG tablet, Take 10 mg by mouth daily., Disp: , Rfl:  Past Medical History:  Diagnosis Date   Arthritis    Asthma    childhood   Clicking tinnitus of right ear    Constipation    Estrogen deficiency    GERD (gastroesophageal reflux disease)    Glaucoma    Hypercalcemia    Hypercholesterolemia    Hypertension    Hypothyroidism  Insomnia    Menopausal symptom    Osteoarthritis of knee    Osteopenia    Pain, joint, hand, right    Paresthesia    Sleep apnea    not used in 7-8 years per pt   Thyroid  disease    Trigger finger    Past Surgical History:  Procedure Laterality Date   ABDOMINAL HYSTERECTOMY     bilateral cataract surgery      03/2022   BREAST EXCISIONAL BIOPSY Left    CLOSED REDUCTION MANDIBLE WITH MANDIBULOMA N/A 04/05/2022   Procedure: CLOSED REDUCTION MANDIBLE WITH MANDIBULOMAXILLARY FUSION;  Surgeon: Helga Bettyann SQUIBB, DMD;  Location: [facility] OR;  Service: Oral Surgery;  Laterality: N/A;   dental implants       x 3 on 09/29/22   FOOT SURGERY  2019   Arch reconstruction   GLAUCOMA SURGERY     at same time as cataract surgery   SHOULDER SURGERY Right    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   Procedure: TOTAL KNEE ARTHROPLASTY;  Surgeon: Melodi Lerner, MD;  Location: [facility];  Service: Orthopedics;  Laterality: Right;   TOTAL KNEE ARTHROPLASTY Left 01/25/2023   Procedure: TOTAL KNEE ARTHROPLASTY;  Surgeon: Melodi Lerner, MD;  Location: [facility]S;  Service: Orthopedics;  Laterality: Left;   Family History  Problem Relation Age of Onset   Diabetes Father    Cancer Father    Hypertension Father    Social History   Socioeconomic History   Marital status: Divorced    Spouse name: Not on file   Number of children: Not on file   Years of education: Not on file   Highest education level: Not on file  Occupational History   Not on file  Tobacco Use   Smoking status: Never   Smokeless tobacco: Never  Vaping Use   Vaping status: Never Used  Substance and Sexual Activity   Alcohol use: No   Drug use: Never   Sexual activity: Never  Other Topics Concern   Not on file  Social History Narrative   Not on file   Social Drivers of Health   Financial Resource Strain: Not on file  Food Insecurity: No Food Insecurity (01/25/2023)   Hunger Vital Sign    Worried About Running Out of Food in the Last Year: Never true    Ran Out of Food in the Last Year: Never true  Transportation Needs: No Transportation Needs (01/25/2023)   PRAPARE - Administrator, Civil Service (Medical): No    Lack of Transportation (Non-Medical): No  Physical Activity: Not on file  Stress: Not on file  Social Connections: Not on file  Intimate Partner Violence: Not At Risk (01/25/2023)   Humiliation, Afraid, Rape, and Kick questionnaire    Fear of Current or Ex-Partner: No    Emotionally Abused: No    Physically Abused: No    Sexually Abused: No         Objective:  BP 120/64   Pulse 84   Temp 97.8 F  (36.6 C) (Oral)   Ht 5' 5 (1.651 m)   Wt 170 lb 9.6 oz (77.4 kg)   SpO2 96% Comment: room air  BMI 28.39 kg/m    Physical Exam Constitutional:      General: She is not in acute distress.    Appearance: Normal appearance.  HENT:     Mouth/Throat:     Mouth: Mucous membranes are moist.  Cardiovascular:     Rate and  Rhythm: Normal rate.  Pulmonary:     Effort: No respiratory distress.     Breath sounds: No wheezing or rales.  Musculoskeletal:     Right lower leg: No edema.     Left lower leg: No edema.  Skin:    General: Skin is warm.  Neurological:     Mental Status: She is alert and oriented to person, place, and time.  Psychiatric:        Mood and Affect: Mood normal.     Diagnostic Review:    Pft     No data to display               Results Echo 04/2022 1. Left ventricular ejection fraction, by estimation, is 60 to 65%. The  left ventricle has normal function. The left ventricle has no regional  wall motion abnormalities. Left ventricular diastolic parameters were  normal.   2. Right ventricular systolic function is normal. The right ventricular  size is normal. There is normal pulmonary artery systolic pressure. The  estimated right ventricular systolic pressure is 22.3 mmHg.   3. The mitral valve is grossly normal. Trivial mitral valve  regurgitation.   4. The aortic valve is tricuspid. Aortic valve regurgitation is not  visualized. Aortic valve sclerosis is present, with no evidence of aortic  valve stenosis.   5. The inferior vena cava is normal in size with <50% respiratory  variability, suggesting right atrial pressure of 8 mmHg.     RADIOLOGY Chest X-ray: Mostly clear (05/2024) Chest CT: Lower lung haziness and changes, possible infection, areas of atelectasis (04/2022)       Assessment & Plan:   Assessment & Plan Chronic cough I explained to the patient in detail that chronic cough could be a manifestation of chronic sinusitis with  post nasal drip, cough variant asthma, Nonasthmatic eosinophilic bronchitis, gastroesophageal reflux, laryngopharyngeal reflux, parenchymal lung disease like fibrosis, pulmonary edema from congestive heart failure, habitual cough or lung cancer. No edema on exam Denies much sinus drainage currently Will r/o ILD, cough variant asthma Pt already on reflux medications Orders:   CBC with Differential   IgE; Future   B Nat Peptide; Future   CT CHEST HIGH RESOLUTION; Future   Pulmonary function test; Future  Environmental allergies Childhood hx of asthma Continue zyrtec and as needed flonase Orders:   Pulmonary function test; Future   RESPIRATORY ALLERGY  PANEL REGION II W/ RFLX: [place]; Future  Abnormal findings on diagnostic imaging of lung Prior CT chest 2023 shows some reticulations, atelectasis  She has significant arthritis Will r/o ILD- HRCT now Orders:   CT CHEST HIGH RESOLUTION; Future   Pulmonary function test; Future    Thank you for the opportunity to take part in the care of RHILYN BATTLE   Return in about 6 weeks (around 10/06/2024).   Kia Varnadore Pleas, MD [facility] Office: [phone]

## 2024-08-25 NOTE — Patient Instructions (Addendum)
 It was a pleasure to see you today. Please have your labs drawn in our clinic today. Your pulmonary function test will be scheduled at check out Your will receive a call for your CT chest scheduling.

## 2024-08-28 LAB — IGE: IgE (Immunoglobulin E), Serum: 24 kU/L (ref <=114)

## 2024-08-29 ENCOUNTER — Ambulatory Visit: Payer: Self-pay

## 2024-08-29 LAB — RESPIRATORY ALLERGY PANEL REGION II W/ RFLX: ~~LOC~~
Allergen, A. alternata, m6: <0.1 kU/L
Allergen, Cedar tree, t12: 0.33 kU/L — ABNORMAL HIGH
Allergen, Comm Silver Birch, t9: <0.1 kU/L
Allergen, Cottonwood, t14: <0.1 kU/L
Allergen, Mouse Urine Protein, e78: <0.1 kU/L
Allergen, Mulberry, t76: <0.1 kU/L
Allergen, Oak,t7: <0.1 kU/L
Allergen, P. notatum, m1: <0.1 kU/L
Aspergillus fumigatus, m3: <0.1 kU/L
Bermuda Grass: <0.1 kU/L
Box Elder IgE: <0.1 kU/L
CLADOSPORIUM HERBARUM (M2) IGE: <0.1 kU/L
COMMON RAGWEED (SHORT) (W1) IGE: 0.19 kU/L — ABNORMAL HIGH
Cat Dander: <0.1 kU/L
Class: 0
Class: 0
Class: 0
Class: 0
Class: 0
Class: 0
Class: 0
Class: 0
Class: 0
Class: 0
Class: 0
Class: 0
Class: 0
Class: 0
Class: 0
Class: 0
Class: 0
Class: 0
Class: 0
Class: 0
Class: 0
Cockroach: <0.1 kU/L
D. farinae: <0.1 kU/L
Dog Dander: <0.1 kU/L
Elm IgE: 0.14 kU/L — ABNORMAL HIGH
IgE (Immunoglobulin E), Serum: 24 kU/L (ref <=114)
IgE (Immunoglobulin E), Serum: <24 kU/L (ref <=114)
Johnson Grass: <0.1 kU/L
Pecan/Hickory Tree IgE: <0.1 kU/L
Rough Pigweed  IgE: <0.1 kU/L
Sheep Sorrel IgE: <0.1 kU/L
Timothy Grass: <0.1 kU/L

## 2024-08-29 LAB — INTERPRETATION:

## 2024-08-30 ENCOUNTER — Ambulatory Visit
Admission: RE | Admit: 2024-08-30 | Discharge: 2024-08-30 | Disposition: A | Discharge status: Home / Self Care | Source: Ambulatory Visit

## 2024-08-30 DIAGNOSIS — R053 Chronic cough: Secondary | ICD-10-CM | POA: Diagnosis not present

## 2024-08-30 DIAGNOSIS — J479 Bronchiectasis, uncomplicated: Secondary | ICD-10-CM | POA: Diagnosis not present

## 2024-08-30 DIAGNOSIS — R918 Other nonspecific abnormal finding of lung field: Secondary | ICD-10-CM

## 2024-09-08 ENCOUNTER — Telehealth: Payer: Self-pay

## 2024-09-08 MED ORDER — DOXYCYCLINE HYCLATE 100 MG PO TABS: 100.0000 mg | ORAL_TABLET | Freq: Two times a day (BID) | ORAL | 0 refills | Status: DC

## 2024-09-08 NOTE — Telephone Encounter (Signed)
 Copied from CRM [id number]. Topic: Clinical - Lab/Test Results >> Sep 06, 2024  4:30 PM Leila C wrote: Reason for CRM: Patient [phone] wants CT chest results, and to send results to pcp Dr. Vernell Fort. Please advise and call back.    Spoke with patient VBU- as soon as provider has a chance to review someone will call back with result / shared with patient PCP like requested

## 2024-09-08 NOTE — Telephone Encounter (Signed)
 Ct chest shows some infiltrates on right lung, could be early bronchitis/pneumonia. If she still has cough or if it has worsened, please advise her to take doxycycline  100mg  BID- which I have sent to her pharmacy. If cough unchanged from baseline, or if she had recent viral infection, she can hold off on antibiotics.  I called the patient twice, unable to reach her. Pulm clinical team, Please attempt again later.  Also her allergy  labs was positive for ragweed and cedar tree.   CC: Dr Rolinda.

## 2024-09-11 NOTE — Telephone Encounter (Signed)
 Spoke with the pt and notified of response per Dr. Pleas. She verbalized understanding. Still coughing a good bit, so she went ahead and started on Doxy. Will keep planned f/u 10/17/24.  Will call sooner if needed.

## 2024-09-19 ENCOUNTER — Other Ambulatory Visit: Payer: Self-pay

## 2024-09-19 ENCOUNTER — Telehealth: Payer: Self-pay

## 2024-09-19 ENCOUNTER — Encounter (HOSPITAL_COMMUNITY): Payer: Self-pay

## 2024-09-19 ENCOUNTER — Emergency Department (HOSPITAL_COMMUNITY)
Admission: EM | Admit: 2024-09-19 | Discharge: 2024-09-20 | Disposition: A | Attending: Emergency Medicine | Admitting: Emergency Medicine

## 2024-09-19 DIAGNOSIS — J45901 Unspecified asthma with (acute) exacerbation: Secondary | ICD-10-CM | POA: Diagnosis not present

## 2024-09-19 DIAGNOSIS — R0602 Shortness of breath: Secondary | ICD-10-CM | POA: Diagnosis present

## 2024-09-19 DIAGNOSIS — Z79899 Other long term (current) drug therapy: Secondary | ICD-10-CM | POA: Diagnosis not present

## 2024-09-19 MED ORDER — LEVOFLOXACIN 750 MG PO TABS: 750.0000 mg | ORAL_TABLET | Freq: Every day | ORAL | 0 refills | Status: DC

## 2024-09-19 NOTE — Telephone Encounter (Signed)
 Please advise: Patient was given doxy on 09/11/24 Reason for CRM: Patient is calling to state cough is not better and patient is still producing phlegm. Patient would like provider to prescribe alternative treatment.

## 2024-09-19 NOTE — Addendum Note (Signed)
 Addended by: Hjalmar Ballengee on: 09/19/2024 11:49 AM   Modules accepted: Orders

## 2024-09-19 NOTE — Telephone Encounter (Addendum)
 I have ordered a different antibiotic called levoquin Advise her to take this for 10 days. However if by next week the symptoms are not improving, advise her to be seen in person in pulmonary clinic/PCP/ urgent care for labs and chest xray If symptoms get worse and she is developing shortness of breath or has low oxygen or fever,recommend ED visit to rule out complications of pneumonia

## 2024-09-19 NOTE — Telephone Encounter (Signed)
 Copied from CRM #[id number]. Topic: Clinical - Medical Advice >> Sep 18, 2024 10:53 AM Gabrielle [name] wrote: Reason for CRM: Patient is calling to state cough is not better and patient is still producing phlegm. Patient would like provider to prescribe alternative treatment. >> Sep 19, 2024 12:01 PM Gabrielle [name] wrote: Pt called in returning call - I went ahead and relayed message per Dr. Pleas   I have ordered [name] different antibiotic called levoquin Advise her to take this for 10 days. However if by next week the symptoms are not improving, advise her to be seen in person in pulmonary clinic/PCP/ urgent care for labs and chest xray If symptoms get worse and she is developing shortness of breath or has low oxygen or fever,recommend ED visit to rule out complications of pneumonia    Pt verbalized understanding and stated she will go ahead and pick up new rx for levoquin  NFN at this time info relayed

## 2024-09-19 NOTE — ED Triage Notes (Signed)
 Pt states that she feels winded. Pt currently taking abts for possible pna.

## 2024-09-19 NOTE — Telephone Encounter (Signed)
 ATC x1.  LVM to return call.  Sent mychart message as well.

## 2024-09-20 ENCOUNTER — Emergency Department (HOSPITAL_COMMUNITY)

## 2024-09-20 LAB — BASIC METABOLIC PANEL WITH GFR
Anion gap: 7 (ref 5–15)
BUN: 13 mg/dL (ref 8–23)
CO2: 28 mmol/L (ref 22–32)
Calcium: 10.9 mg/dL — ABNORMAL HIGH (ref 8.9–10.3)
Chloride: 105 mmol/L (ref 98–111)
Creatinine, Ser: 0.73 mg/dL (ref 0.44–1.00)
GFR, Estimated: >60 mL/min (ref >60)
Glucose, Bld: 117 mg/dL — ABNORMAL HIGH (ref 70–99)
Potassium: 3.8 mmol/L (ref 3.5–5.1)
Sodium: 140 mmol/L (ref 135–145)

## 2024-09-20 LAB — CBC
HCT: 43.1 % (ref 36.0–46.0)
Hemoglobin: 14.3 g/dL (ref 12.0–15.0)
MCH: 31.8 pg (ref 26.0–34.0)
MCHC: 33.2 g/dL (ref 30.0–36.0)
MCV: 96 fL (ref 80.0–100.0)
Platelets: 189 K/uL (ref 150–400)
RBC: 4.49 MIL/uL (ref 3.87–5.11)
RDW: 12.2 % (ref 11.5–15.5)
WBC: 4.6 K/uL (ref 4.0–10.5)
nRBC: 0 % (ref 0.0–0.2)

## 2024-09-20 MED ORDER — PREDNISONE 20 MG PO TABS: 40.0000 mg | ORAL_TABLET | Freq: Every day | ORAL | 0 refills | Status: DC

## 2024-09-20 MED ORDER — PREDNISONE 20 MG PO TABS
60.0000 mg | ORAL_TABLET | Freq: Once | ORAL | Status: AC
  Administered 2024-09-20: 05:00:00 60 mg via ORAL
  Filled 2024-09-20: qty 3

## 2024-09-20 MED ORDER — IPRATROPIUM-ALBUTEROL 0.5-2.5 (3) MG/3ML IN SOLN
3.0000 mL | Freq: Once | RESPIRATORY_TRACT | Status: AC
  Administered 2024-09-20: 05:00:00 3 mL via RESPIRATORY_TRACT
  Filled 2024-09-20: qty 3

## 2024-09-20 MED ORDER — ALBUTEROL SULFATE HFA 108 (90 BASE) MCG/ACT IN AERS: 1.0000 | INHALATION_SPRAY | Freq: Four times a day (QID) | RESPIRATORY_TRACT | 0 refills | Status: DC | PRN
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
## 2024-09-20 NOTE — Discharge Instructions (Addendum)
 As discussed, I believe that your symptoms tonight were secondary to asthma exacerbation.  Please begin taking 40 mg of steroids on 12/18.  You will take these for 3 days.  You may utilize your albuterol  inhaler for shortness of breath as needed.  Please follow-up with your PCP.  Return to ED if new symptoms.

## 2024-09-25 ENCOUNTER — Inpatient Hospital Stay: Admission: RE | Admit: 2024-09-25 | Discharge: 2024-09-25 | Attending: Family Medicine

## 2024-09-25 DIAGNOSIS — Z1231 Encounter for screening mammogram for malignant neoplasm of breast: Secondary | ICD-10-CM

## 2024-10-16 ENCOUNTER — Ambulatory Visit (HOSPITAL_BASED_OUTPATIENT_CLINIC_OR_DEPARTMENT_OTHER)

## 2024-10-16 DIAGNOSIS — R918 Other nonspecific abnormal finding of lung field: Secondary | ICD-10-CM

## 2024-10-16 DIAGNOSIS — R053 Chronic cough: Secondary | ICD-10-CM

## 2024-10-16 DIAGNOSIS — Z9109 Other allergy status, other than to drugs and biological substances: Secondary | ICD-10-CM

## 2024-10-16 LAB — PULMONARY FUNCTION TEST
DL/VA % pred: 124 %
DL/VA: 5.07 ml/min/mmHg/L
DLCO cor % pred: 111 %
DLCO cor: 21.97 ml/min/mmHg
DLCO unc % pred: 114 %
DLCO unc: 22.55 ml/min/mmHg
FEF 25-75 Post: 2.51 L/s
FEF 25-75 Pre: 1.44 L/s
FEF2575-%Change-Post: 73 %
FEF2575-%Pred-Post: 155 %
FEF2575-%Pred-Pre: 89 %
FEV1-%Change-Post: 17 %
FEV1-%Pred-Post: 97 %
FEV1-%Pred-Pre: 82 %
FEV1-Post: 2.08 L
FEV1-Pre: 1.77 L
FEV1FVC-%Change-Post: 4 %
FEV1FVC-%Pred-Pre: 102 %
FEV6-%Change-Post: 11 %
FEV6-%Pred-Post: 96 %
FEV6-%Pred-Pre: 85 %
FEV6-Post: 2.61 L
FEV6-Pre: 2.33 L
FEV6FVC-%Change-Post: 0 %
FEV6FVC-%Pred-Post: 105 %
FEV6FVC-%Pred-Pre: 105 %
FVC-%Change-Post: 11 %
FVC-%Pred-Post: 91 %
FVC-%Pred-Pre: 81 %
FVC-Post: 2.61 L
FVC-Pre: 2.33 L
Post FEV1/FVC ratio: 80 %
Post FEV6/FVC ratio: 100 %
Pre FEV1/FVC ratio: 76 %
Pre FEV6/FVC Ratio: 100 %
RV % pred: 149 %
RV: 3.55 L
TLC % pred: 119 %
TLC: 6.23 L

## 2024-10-16 NOTE — Progress Notes (Signed)
 Full PFT performed today.

## 2024-10-16 NOTE — Patient Instructions (Signed)
 Full PFT performed today.

## 2024-10-17 ENCOUNTER — Ambulatory Visit

## 2024-10-19 ENCOUNTER — Ambulatory Visit

## 2024-10-19 VITALS — BP 125/71 | HR 72 | Temp 97.8°F | Ht 65.0 in | Wt 170.0 lb

## 2024-10-19 DIAGNOSIS — R918 Other nonspecific abnormal finding of lung field: Secondary | ICD-10-CM | POA: Diagnosis not present

## 2024-10-19 DIAGNOSIS — J4540 Moderate persistent asthma, uncomplicated: Secondary | ICD-10-CM | POA: Diagnosis not present

## 2024-10-19 DIAGNOSIS — Z9109 Other allergy status, other than to drugs and biological substances: Secondary | ICD-10-CM

## 2024-10-19 MED ORDER — FLUTICASONE-SALMETEROL 250-50 MCG/ACT IN AEPB: 1.0000 | INHALATION_SPRAY | Freq: Two times a day (BID) | RESPIRATORY_TRACT | 11 refills | Status: AC

## 2024-10-19 MED ORDER — ZYRTEC ALLERGY 10 MG PO TABS: 10.0000 mg | ORAL_TABLET | Freq: Every day | ORAL | 5 refills | Status: AC

## 2024-10-19 MED ORDER — ALBUTEROL SULFATE HFA 108 (90 BASE) MCG/ACT IN AERS: 1.0000 | INHALATION_SPRAY | Freq: Four times a day (QID) | RESPIRATORY_TRACT | 5 refills | Status: AC | PRN

## 2024-10-19 NOTE — Progress Notes (Signed)
 "  New Patient Pulmonology Office Visit   Subjective:  Patient ID: Gabrielle [name], female    DOB: [birth year]  MRN: [id number]  Referred by: Rolinda Millman, MD  CC:  Chief Complaint  Patient presents with   Shortness of Breath    Pft and ct result.  Patient sob and cough has improved some.    Seen in this clinic for allergies, asthma and chronic cough. First office visit with me Nov, 2025. 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 Noted to have pneumonia on Ct chest Dec 2025. Treated for it with levoquin.     Shortness of Breath   Gabrielle [name] is a 79 y.o. female who is referred to this clinic for chronic cough.  Discussed the use of AI scribe software for clinical note transcription with the patient, who gave verbal consent to proceed.  History of Present Illness Gabrielle [name] is a 79 year old female with asthma who presents with persistent cough and recent pneumonia.  She has a history of asthma, which had been inactive since age 63 but has recently flared up. She experiences wheezing and a persistent cough. She uses albuterol  as a rescue inhaler and reports improvement with it  A recent CT scan showed pneumonia, for which she was prescribed antibiotics. However, her symptoms worsened, leading to a hospital visit where she received breathing treatments and prednisone , which improved her condition.  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 She takes Zyrtec  daily for allergies.  She takes a thyroid  tablet every morning and has concerns about her calcium  intake, as her levels were slightly elevated during a recent hospital visit. She has stopped taking calcium  supplements for now.   Review of Systems  Respiratory:  Positive for shortness of breath.    Review of symptoms  negative except mentioned above   Allergies: Lidocaine , Aspirin, Hylan g-f 20, Lisinopril, Losartan, Metoprolol, Olmesartan, Codeine, Latex, Sulfa antibiotics, and Wound dressing adhesive  Current Outpatient Medications:    acetaminophen  (TYLENOL ) 500 MG tablet, Take 500 mg by mouth as needed for moderate pain., Disp: , Rfl:    amLODipine  (NORVASC ) 5 MG tablet, Take 5 mg by mouth daily. (Patient taking differently: Take 10 mg by mouth daily.), Disp: , Rfl:    ezetimibe  (ZETIA ) 10 MG tablet, Take 10 mg by mouth daily., Disp: , Rfl:    fluticasone -salmeterol (ADVAIR) 250-50 MCG/ACT AEPB, Inhale 1 puff into the lungs every 12 (twelve) hours., Disp: 60 each, Rfl: 11   lactose free nutrition (BOOST) LIQD, Take 237 mLs by mouth 2 (two) times daily between meals., Disp: , Rfl:    levothyroxine  (SYNTHROID , LEVOTHROID) 50 MCG tablet, Take 50 mcg by mouth daily before breakfast., Disp: , Rfl:    potassium chloride  (KLOR-CON ) 10 MEQ tablet, Take 10 mEq by mouth every morning., Disp: , Rfl:    raloxifene  (EVISTA ) 60 MG tablet, Take 60 mg by mouth every morning., Disp: , Rfl:    rosuvastatin  (CRESTOR ) 5 MG tablet, Take 5 mg by mouth 3 (three) times a week., Disp: , Rfl:    albuterol  (VENTOLIN  HFA) 108 (90 Base) MCG/ACT inhaler, Inhale 1-2 puffs into the lungs every 6 (six) hours as needed for wheezing or shortness of breath., Disp: 18 g, Rfl: 5   polyethylene glycol (MIRALAX  / GLYCOLAX ) 17  g packet, Take 17 g by mouth daily as needed for mild constipation. (Patient not taking: Reported on 10/19/2024), Disp: 30 each, Rfl: 0   ZYRTEC  ALLERGY  10 MG tablet, Take 1 tablet (10 mg total) by mouth daily., Disp: 30 tablet, Rfl: 5 Past Medical History:  Diagnosis Date   Arthritis    Asthma    childhood   Clicking tinnitus of right ear    Constipation    Estrogen deficiency    GERD (gastroesophageal reflux disease)    Glaucoma    Hypercalcemia    Hypercholesterolemia    Hypertension    Hypothyroidism     Insomnia    Menopausal symptom    Osteoarthritis of knee    Osteopenia    Pain, joint, hand, right    Paresthesia    Sleep apnea    not used in 7-8 years per pt   Thyroid  disease    Trigger finger    Past Surgical History:  Procedure Laterality Date   ABDOMINAL HYSTERECTOMY     bilateral cataract surgery      03/2022   BREAST EXCISIONAL BIOPSY Left    CLOSED REDUCTION MANDIBLE WITH MANDIBULOMA N/A 04/05/2022   Procedure: CLOSED REDUCTION MANDIBLE WITH MANDIBULOMAXILLARY FUSION;  Surgeon: Helga Bettyann SQUIBB, DMD;  Location: [facility] OR;  Service: Oral Surgery;  Laterality: N/A;   dental implants      x 3 on 09/29/22   FOOT SURGERY  2019   Arch reconstruction   GLAUCOMA SURGERY     at same time as cataract surgery   SHOULDER SURGERY Right    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   Procedure: TOTAL KNEE ARTHROPLASTY;  Surgeon: Melodi Lerner, MD;  Location: [facility];  Service: Orthopedics;  Laterality: Right;   TOTAL KNEE ARTHROPLASTY Left 01/25/2023   Procedure: TOTAL KNEE ARTHROPLASTY;  Surgeon: Melodi Lerner, MD;  Location: [facility];  Service: Orthopedics;  Laterality: Left;   Family History  Problem Relation Age of Onset   Diabetes Father    Cancer Father    Hypertension Father    Social History   Socioeconomic History   Marital status: Divorced    Spouse name: Not on file   Number of children: Not on file   Years of education: Not on file   Highest education level: Not on file  Occupational History   Not on file  Tobacco Use   Smoking status: Never   Smokeless tobacco: Never  Vaping Use   Vaping status: Never Used  Substance and Sexual Activity   Alcohol use: No   Drug use: Never   Sexual activity: Never  Other Topics Concern   Not on file  Social History Narrative   Not on file   Social Drivers of Health   Tobacco Use: Low Risk (10/19/2024)   Patient History    Smoking Tobacco Use: Never    Smokeless Tobacco Use: Never    Passive Exposure: Not on file   Financial Resource Strain: Not on file  Food Insecurity: No Food Insecurity (01/25/2023)   Hunger Vital Sign    Worried About Running Out of Food in the Last Year: Never true    Ran Out of Food in the Last Year: Never true  Transportation Needs: No Transportation Needs (01/25/2023)   PRAPARE - Administrator, Civil Service (Medical): No    Lack of Transportation (Non-Medical): No  Physical Activity: Not on file  Stress: Not on file  Social Connections: Not on file  Intimate  Partner Violence: Not At Risk (01/25/2023)   Humiliation, Afraid, Rape, and Kick questionnaire    Fear of Current or Ex-Partner: No    Emotionally Abused: No    Physically Abused: No    Sexually Abused: No  Depression (PHQ2-9): Not on file  Alcohol Screen: Not on file  Housing: Low Risk (01/25/2023)   Housing    Last Housing Risk Score: 0  Utilities: Not At Risk (01/25/2023)   AHC Utilities    Threatened with loss of utilities: No  Health Literacy: Not on file         Objective:  BP 125/71   Pulse 72   Temp 97.8 F (36.6 C) (Oral)   Ht 5' 5 (1.651 m)   Wt 170 lb (77.1 kg)   SpO2 95%   BMI 28.29 kg/m    Physical Exam Constitutional:      General: She is not in acute distress.    Appearance: Normal appearance.  HENT:     Mouth/Throat:     Mouth: Mucous membranes are moist.  Cardiovascular:     Rate and Rhythm: Normal rate.  Pulmonary:     Effort: No respiratory distress.     Breath sounds: No wheezing or rales.  Musculoskeletal:     Right lower leg: No edema.     Left lower leg: No edema.  Skin:    General: Skin is warm.  Neurological:     Mental Status: She is alert and oriented to person, place, and time.  Psychiatric:        Mood and Affect: Mood normal.     Diagnostic Review:    Pft    Latest Ref Rng & Units 10/16/2024    9:22 AM  PFT Results  FVC-Pre L 2.33  P  FVC-Predicted Pre % 81  P  FVC-Post L 2.61  P  FVC-Predicted Post % 91  P  Pre FEV1/FVC % % 76  P   Post FEV1/FCV % % 80  P  FEV1-Pre L 1.77  P  FEV1-Predicted Pre % 82  P  FEV1-Post L 2.08  P  DLCO uncorrected ml/min/mmHg 22.55  P  DLCO UNC% % 114  P  DLCO corrected ml/min/mmHg 21.97  P  DLCO COR %Predicted % 111  P  DLVA Predicted % 124  P  TLC L 6.23  P  TLC % Predicted % 119  P  RV % Predicted % 149  P    P Preliminary result   Significantly positive bronchodilator response      Results Respi panel 08/2024 Allergen cedar tree, elm tree, ragweed  AEC 100   Echo 04/2022 1. Left ventricular ejection fraction, by estimation, is 60 to 65%. The  left ventricle has normal function. The left ventricle has no regional  wall motion abnormalities. Left ventricular diastolic parameters were  normal.   2. Right ventricular systolic function is normal. The right ventricular  size is normal. There is normal pulmonary artery systolic pressure. The  estimated right ventricular systolic pressure is 22.3 mmHg.   3. The mitral valve is grossly normal. Trivial mitral valve  regurgitation.   4. The aortic valve is tricuspid. Aortic valve regurgitation is not  visualized. Aortic valve sclerosis is present, with no evidence of aortic  valve stenosis.   5. The inferior vena cava is normal in size with <50% respiratory  variability, suggesting right atrial pressure of 8 mmHg.     RADIOLOGY Chest X-ray: Mostly clear (05/2024) Chest CT: Lower  lung haziness and changes, possible infection, areas of atelectasis (04/2022)    Labs Allergy  panel: Positive for cedar tree, elm tree, and ragweed; negative for cat dander and dog dander Serum calcium : 10.9  Radiology Chest CT (08/2024): Early pneumonia involving lung parenchyma, without dense consolidation (Independently interpreted)  Diagnostic Spirometry: Findings consistent with asthma; significant improvement in values after albuterol  administration      Assessment & Plan:   Assessment & Plan Moderate persistent asthma not  dependent on systemic steroids  Orders:   fluticasone -salmeterol (ADVAIR) 250-50 MCG/ACT AEPB; Inhale 1 puff into the lungs every 12 (twelve) hours.   DG Chest 2 View; Future   albuterol  (VENTOLIN  HFA) 108 (90 Base) MCG/ACT inhaler; Inhale 1-2 puffs into the lungs every 6 (six) hours as needed for wheezing or shortness of breath.  Environmental allergies  Orders:   ZYRTEC  ALLERGY  10 MG tablet; Take 1 tablet (10 mg total) by mouth daily.  Abnormal findings on diagnostic imaging of lung  Orders:   DG Chest 2 View; Future   Pulm function test based consistent with asthma.  Chronic cough likely related to untreated asthma Will initiate asthma therapy.  Advised patient to rinse mouth after using continuous inhaler Use albuterol  as needed  Advised patient to continue antihistaminics daily.  Offered Singulair but she wants to hold off on it for now  Will obtain a chest x-ray today to ensure resolution of previously identified early pneumonia.  Thank you for the opportunity to take part in the care of MASHAYLA LAVIN   Return in about 3 months (around 01/17/2025).   Gabrielle Forlenza Pleas, MD [facility] Office: [phone] "

## 2024-10-19 NOTE — Patient Instructions (Addendum)
" °  VISIT SUMMARY: You visited today due to a persistent cough and recent pneumonia. You have been experiencing wheezing. We discussed your allergies and current medications.  YOUR PLAN: MODERATE PERSISTENT ASTHMA: Your asthma is causing your wheezing and a chronic cough. This may be triggered by environmental allergens -Use the Advair inhaler, one puff in the morning and one puff at night. -Rinse your mouth after using the inhaler to prevent thrush. -Continue using albuterol  as needed for acute symptoms. -We will consider Singulair if your asthma remains uncontrolled.  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 Your symptoms are exacerbated by exposure to these allergens and possibly wood smoke. -Continue taking Zyrtec  daily for allergy  management. -Monitor your symptoms, and we may add Singulair if they persist.  PNEUMONIA, RESOLVING: You had early pneumonia, which has improved with antibiotics and steroids. -A chest x-ray has been ordered to assess the resolution of your pneumonia.    Contains text generated by [employer].   "

## 2024-10-25 ENCOUNTER — Telehealth: Payer: Self-pay | Admitting: *Deleted

## 2024-10-25 NOTE — Telephone Encounter (Signed)
 Copied from CRM #[id number]. Topic: Clinical - Medical Advice >> Oct 24, 2024  4:32 PM Leila BROCKS wrote: Reason for CRM: Patient [phone] is looking for chest xray results. Informed patient, across [facility] due to shortage of radiologists results can take 2-4 weeks. Patient would like a call back when results are in.   Patient states still coughing mucous- yellowish in color, and went to health store and there's a dietary supplements called Lung, bronchial and sinus health tablets; patient is asking if this ok to take this supplements. Patient states is inhaler twice daily is helping with shortness of breath. Patient is not having symptoms right now. Please advise and call back.  ---------------------------------------------------------------------------------------------------------------------------------------------  Patient is requesting results of her CXR.  Please advise when you return to office. Thank you.

## 2024-10-26 ENCOUNTER — Ambulatory Visit: Payer: Self-pay

## 2024-10-26 NOTE — Telephone Encounter (Signed)
 I called and spoke to pt. Pt informed of Dr Pleas note and verbalized understanding. NFN

## 2024-10-26 NOTE — Telephone Encounter (Signed)
 Chest xray within normal limits Great the inhaler is working. Please remind to rinse mouth after use Wouldn't recommend any health tablets for lungs outside of what's prescribed I dont have objection to multivitamins or other supplements she would like to take at the direction of her PCP but there are not well researched health tablets for lungs specifically.

## 2025-01-17 ENCOUNTER — Ambulatory Visit
